# Patient Record
Sex: Male | Born: 1955 | Hispanic: Yes | Marital: Single | Smoking: Current every day smoker
Health system: Southern US, Community
[De-identification: ages and names within clinical notes are randomized; demographics above are authoritative.]

## PROBLEM LIST (undated history)

## (undated) DIAGNOSIS — J189 Pneumonia, unspecified organism: Secondary | ICD-10-CM

## (undated) DIAGNOSIS — H9190 Unspecified hearing loss, unspecified ear: Secondary | ICD-10-CM

## (undated) HISTORY — PX: KNEE SURGERY: SHX244

---

## 2010-02-05 ENCOUNTER — Emergency Department (HOSPITAL_COMMUNITY): Admission: EM | Admit: 2010-02-05 | Discharge: 2010-02-05 | Payer: Self-pay | Admitting: Emergency Medicine

## 2010-07-16 LAB — RAPID STREP SCREEN (MED CTR MEBANE ONLY): Streptococcus, Group A Screen (Direct): NEGATIVE

## 2012-11-01 ENCOUNTER — Emergency Department (HOSPITAL_COMMUNITY)
Admission: EM | Admit: 2012-11-01 | Discharge: 2012-11-01 | Disposition: A | Discharge status: Home / Self Care | Payer: Self-pay | Attending: Emergency Medicine | Admitting: Emergency Medicine

## 2012-11-01 ENCOUNTER — Encounter (HOSPITAL_COMMUNITY): Payer: Self-pay | Admitting: *Deleted

## 2012-11-01 ENCOUNTER — Emergency Department (HOSPITAL_COMMUNITY): Payer: Self-pay

## 2012-11-01 DIAGNOSIS — S4980XA Other specified injuries of shoulder and upper arm, unspecified arm, initial encounter: Secondary | ICD-10-CM | POA: Insufficient documentation

## 2012-11-01 DIAGNOSIS — S46909A Unspecified injury of unspecified muscle, fascia and tendon at shoulder and upper arm level, unspecified arm, initial encounter: Secondary | ICD-10-CM | POA: Insufficient documentation

## 2012-11-01 DIAGNOSIS — S139XXA Sprain of joints and ligaments of unspecified parts of neck, initial encounter: Secondary | ICD-10-CM | POA: Insufficient documentation

## 2012-11-01 DIAGNOSIS — Y92410 Unspecified street and highway as the place of occurrence of the external cause: Secondary | ICD-10-CM | POA: Insufficient documentation

## 2012-11-01 DIAGNOSIS — W19XXXA Unspecified fall, initial encounter: Secondary | ICD-10-CM

## 2012-11-01 DIAGNOSIS — Y93H2 Activity, gardening and landscaping: Secondary | ICD-10-CM | POA: Insufficient documentation

## 2012-11-01 DIAGNOSIS — M25512 Pain in left shoulder: Secondary | ICD-10-CM

## 2012-11-01 DIAGNOSIS — S161XXA Strain of muscle, fascia and tendon at neck level, initial encounter: Secondary | ICD-10-CM

## 2012-11-01 DIAGNOSIS — F17200 Nicotine dependence, unspecified, uncomplicated: Secondary | ICD-10-CM | POA: Insufficient documentation

## 2012-11-01 MED ORDER — HYDROCODONE-ACETAMINOPHEN 5-325 MG PO TABS: 1.0000 | ORAL_TABLET | ORAL | Status: DC | PRN

## 2012-11-01 MED ORDER — CYCLOBENZAPRINE HCL 10 MG PO TABS: 10.0000 mg | ORAL_TABLET | Freq: Two times a day (BID) | ORAL | Status: DC | PRN

## 2012-11-01 NOTE — ED Notes (Signed)
Pain lt side of neck and shoulder,  Pt got off tractor quickly due to radiator  Bursting and hurt his lt shoulder and lt side of neck,  No LOC  Pain with motion

## 2012-11-01 NOTE — ED Provider Notes (Signed)
Medical screening examination/treatment/procedure(s) were performed by non-physician practitioner and as supervising physician I was immediately available for consultation/collaboration. Salli Bodin, MD, FACEP   Laveta Gilkey L Greyson Riccardi, MD 11/01/12 1559 
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
## 2012-11-01 NOTE — ED Notes (Signed)
Shoulder immobilizer placed on L shoulder per NP order.

## 2013-02-27 ENCOUNTER — Emergency Department (HOSPITAL_COMMUNITY)
Admission: EM | Admit: 2013-02-27 | Discharge: 2013-02-27 | Disposition: A | Discharge status: Home / Self Care | Payer: Self-pay | Attending: Emergency Medicine | Admitting: Emergency Medicine

## 2013-02-27 ENCOUNTER — Encounter (HOSPITAL_COMMUNITY): Payer: Self-pay | Admitting: Emergency Medicine

## 2013-02-27 DIAGNOSIS — F17200 Nicotine dependence, unspecified, uncomplicated: Secondary | ICD-10-CM | POA: Insufficient documentation

## 2013-02-27 DIAGNOSIS — R5383 Other fatigue: Secondary | ICD-10-CM

## 2013-02-27 DIAGNOSIS — R0602 Shortness of breath: Secondary | ICD-10-CM | POA: Insufficient documentation

## 2013-02-27 DIAGNOSIS — Z79899 Other long term (current) drug therapy: Secondary | ICD-10-CM | POA: Insufficient documentation

## 2013-02-27 DIAGNOSIS — M2550 Pain in unspecified joint: Secondary | ICD-10-CM | POA: Insufficient documentation

## 2013-02-27 DIAGNOSIS — R5381 Other malaise: Secondary | ICD-10-CM | POA: Insufficient documentation

## 2013-02-27 DIAGNOSIS — H539 Unspecified visual disturbance: Secondary | ICD-10-CM | POA: Insufficient documentation

## 2013-02-27 LAB — CBC WITH DIFFERENTIAL/PLATELET
Basophils Absolute: 0.1 10*3/uL (ref 0.0–0.1)
Basophils Relative: 1 % (ref 0–1)
Hemoglobin: 14.4 g/dL (ref 13.0–17.0)
MCHC: 33.4 g/dL (ref 30.0–36.0)
Neutro Abs: 4.9 10*3/uL (ref 1.7–7.7)
Neutrophils Relative %: 53 % (ref 43–77)
Platelets: 314 10*3/uL (ref 150–400)
RDW: 13.4 % (ref 11.5–15.5)

## 2013-02-27 LAB — BASIC METABOLIC PANEL
Chloride: 102 mEq/L (ref 96–112)
GFR calc Af Amer: >90 mL/min (ref >90)
Potassium: 3.8 mEq/L (ref 3.5–5.1)
Sodium: 138 mEq/L (ref 135–145)

## 2013-02-27 LAB — GLUCOSE, CAPILLARY: Glucose-Capillary: 101 mg/dL — ABNORMAL HIGH (ref 70–99)

## 2013-02-27 NOTE — ED Provider Notes (Signed)
CSN: [id number]     Arrival date & time 02/27/13  1308 History  This chart was scribed for Dagmar Hait, MD by Bennett Scrape, ED Scribe. This patient was seen in room APA05/APA05 and the patient's care was started at 3:16 PM.     Chief Complaint  Patient presents with  . Hypertension    Patient is a 57 y.o. male presenting with general illness. The history is provided by the patient. No language interpreter was used.  Illness Location:  Diffuse Quality:  Fatigue Severity:  Mild Onset quality:  Gradual Duration:  2 days Timing:  Constant Progression:  Worsening Chronicity:  New Context:  Worked past 60 days straight, started to feel fatigued 2 days ago after getting off of work Associated symptoms: chest pain (brief, now resolved) and fatigue   Associated symptoms: no cough, no diarrhea, no fever, no nausea and no vomiting     HPI Comments: Darrell Parker is a 57 y.o. male who presents to the Emergency Department complaining of 2 days of gradual onset, persistent fatigue that started after working 60 days straight. He reports that he has been staying hydrated and sleep a full night's sleep but has been unable to. On a ladder at work today, he states that he closed his eyes and when he opened them he saw 'clouds' and his vision became shaky. He states that the episode lasted 1 to 2 minutes. He reports one episode of CP 2 days ago after getting off of work. He states that it felt like a hiccup that went away after a few minutes. He states that he also feels SOB described as feeling like he has to force taking a deep breath more than normal. He also c/o diffuse joint pain but denies any emesis, fevers, abdominal pain, hematochezia or diarrhea. He denies any difficulties in his gait. He says that he spoke with his supervisor and asked to be sent home from the job. He states that he was prescribed Norco for ankle pain and finished the prescription last week. He denies being on any pain  medication currently. He denies having a h/o HTN or being on any hypertensives.   History reviewed. No pertinent past medical history. Past Surgical History  Procedure Laterality Date  . Knee surgery     History reviewed. No pertinent family history. History  Substance Use Topics  . Smoking status: Current Every Day Smoker  . Smokeless tobacco: Not on file  . Alcohol Use: No    Review of Systems  Constitutional: Positive for fatigue. Negative for fever.  Eyes: Positive for visual disturbance.  Respiratory: Negative for cough.   Cardiovascular: Positive for chest pain (brief, now resolved).  Gastrointestinal: Negative for nausea, vomiting, diarrhea and blood in stool.  All other systems reviewed and are negative.    Allergies  Motrin  Home Medications   Current Outpatient Rx  Name  Route  Sig  Dispense  Refill  . acetaminophen (TYLENOL) 325 MG tablet   Oral   Take 650 mg by mouth every 6 (six) hours as needed for pain.         . cyclobenzaprine (FLEXERIL) 10 MG tablet   Oral   Take 1 tablet (10 mg total) by mouth 2 (two) times daily as needed for muscle spasms.   20 tablet   0   . HYDROcodone-acetaminophen (NORCO/VICODIN) 5-325 MG per tablet   Oral   Take 1 tablet by mouth every 4 (four) hours as needed.   20  tablet   0    Triage Vitals: BP 144/69  Pulse 70  Temp(Src) 98 F (36.7 C) (Oral)  Resp 20  Ht 6' (1.829 m)  Wt 235 lb (106.595 kg)  BMI 31.86 kg/m2  SpO2 100%  Physical Exam  Nursing note and vitals reviewed. Constitutional: He is oriented to person, place, and time. He appears well-developed and well-nourished. No distress.  HENT:  Head: Normocephalic and atraumatic.  Mouth/Throat: Oropharynx is clear and moist.  Eyes: Conjunctivae and EOM are normal. Pupils are equal, round, and reactive to light.  Neck: Neck supple. No tracheal deviation present.  Cardiovascular: Normal rate, regular rhythm and normal heart sounds.   Pulmonary/Chest:  Effort normal and breath sounds normal. No respiratory distress.  Musculoskeletal: Normal range of motion.  Neurological: He is alert and oriented to person, place, and time.  Skin: Skin is warm and dry.  Psychiatric: He has a normal mood and affect. His behavior is normal.    ED Course  Procedures (including critical care time)  DIAGNOSTIC STUDIES: Oxygen Saturation is 100% on room air, normal by my interpretation.    COORDINATION OF CARE: 3:23 PM-Discussed discharge plan which includes work note for 2 days with pt at bedside and pt agreed to plan. Addressed symptoms to return for with pt.   Labs Review Labs Reviewed  CBC WITH DIFFERENTIAL - Abnormal; Notable for the following:    Eosinophils Relative 8 (*)    All other components within normal limits  BASIC METABOLIC PANEL - Abnormal; Notable for the following:    Glucose, Bld 113 (*)    All other components within normal limits  GLUCOSE, CAPILLARY - Abnormal; Notable for the following:    Glucose-Capillary 101 (*)    All other components within normal limits   Imaging Review No results found.  EKG Interpretation     Ventricular Rate:  77 PR Interval:  160 QRS Duration: 90 QT Interval:  408 QTC Calculation: 461 R Axis:   67 Text Interpretation:  Normal sinus rhythm Normal ECG No previous ECGs available            MDM   1. Fatigue    57 year old male presents with fatigue. States she's been working for the past 60 days without a day off. He denies any fevers, chest pain, shortness of breath. He states fatigue over the past several days. He's not been sleeping to increase work load. He stated today he closed his eyes on a ladder open his eyes and saw some stars. He did not have shooting lights or halos consistent with retinal attachment. His vision now is normal. Your port 1 hiccups chest pain that lasted about 5 seconds, but not no chest pain. Patient stated later that he really just wants a note for work to 3  days off. Patient's labs normal. Exam is benign. I'm not concerned about stroke, ACS, infectious source. Given note for work and discharged home.5    Dagmar Hait, MD 02/27/13 [phone]

## 2013-02-27 NOTE — ED Notes (Addendum)
Pt states has not had a day off work in 65 days. Feels like his bp is high. C/o being "really really" tired. Alert/oriented. Nad. Denies pain. Denies n/v/d/dizziness/sob. Has had some blurred vision while at work and saw "white clouds". nad at this time. Denies gu/gi sx's.

## 2013-02-27 NOTE — ED Notes (Signed)
ekg and cbg done in triage at this time. Darrell Parker

## 2014-03-05 ENCOUNTER — Encounter (HOSPITAL_COMMUNITY): Payer: Self-pay | Admitting: Emergency Medicine

## 2014-03-05 ENCOUNTER — Emergency Department (HOSPITAL_COMMUNITY)
Admission: EM | Admit: 2014-03-05 | Discharge: 2014-03-05 | Disposition: A | Discharge status: Home / Self Care | Payer: Self-pay | Attending: Emergency Medicine | Admitting: Emergency Medicine

## 2014-03-05 ENCOUNTER — Emergency Department (HOSPITAL_COMMUNITY): Payer: Self-pay

## 2014-03-05 DIAGNOSIS — Z720 Tobacco use: Secondary | ICD-10-CM | POA: Insufficient documentation

## 2014-03-05 DIAGNOSIS — W3189XA Contact with other specified machinery, initial encounter: Secondary | ICD-10-CM | POA: Insufficient documentation

## 2014-03-05 DIAGNOSIS — Y9389 Activity, other specified: Secondary | ICD-10-CM | POA: Insufficient documentation

## 2014-03-05 DIAGNOSIS — Y288XXA Contact with other sharp object, undetermined intent, initial encounter: Secondary | ICD-10-CM | POA: Insufficient documentation

## 2014-03-05 DIAGNOSIS — Y990 Civilian activity done for income or pay: Secondary | ICD-10-CM | POA: Insufficient documentation

## 2014-03-05 DIAGNOSIS — Y9289 Other specified places as the place of occurrence of the external cause: Secondary | ICD-10-CM | POA: Insufficient documentation

## 2014-03-05 DIAGNOSIS — R52 Pain, unspecified: Secondary | ICD-10-CM

## 2014-03-05 DIAGNOSIS — S61432A Puncture wound without foreign body of left hand, initial encounter: Secondary | ICD-10-CM | POA: Insufficient documentation

## 2014-03-05 MED ORDER — DOXYCYCLINE HYCLATE 100 MG PO CAPS: 100.0000 mg | ORAL_CAPSULE | Freq: Two times a day (BID) | ORAL | Status: DC

## 2014-03-05 MED ORDER — TETANUS-DIPHTH-ACELL PERTUSSIS 5-2.5-18.5 LF-MCG/0.5 IM SUSP
0.5000 mL | Freq: Once | INTRAMUSCULAR | Status: AC
  Administered 2014-03-05: 0.5 mL via INTRAMUSCULAR
  Filled 2014-03-05: qty 0.5

## 2014-03-05 MED ORDER — DOXYCYCLINE HYCLATE 100 MG PO TABS
100.0000 mg | ORAL_TABLET | Freq: Once | ORAL | Status: AC
  Administered 2014-03-05: 100 mg via ORAL
  Filled 2014-03-05: qty 1

## 2014-03-05 MED ORDER — AMOXICILLIN-POT CLAVULANATE 875-125 MG PO TABS
1.0000 | ORAL_TABLET | Freq: Once | ORAL | Status: AC
  Administered 2014-03-05: 1 via ORAL
  Filled 2014-03-05: qty 1

## 2014-03-05 MED ORDER — AMOXICILLIN-POT CLAVULANATE 875-125 MG PO TABS: 1.0000 | ORAL_TABLET | Freq: Two times a day (BID) | ORAL | Status: DC

## 2014-03-05 NOTE — Discharge Instructions (Signed)
Keep hand clean with soap and water. Recheck in 2 days. Prescriptions for 2 antibiotics.   Hand injuries have a high incidence of infection.   Return for any worrisome signs.

## 2014-03-05 NOTE — ED Notes (Signed)
Finger splint applied.  Cap refill good both before and after splint with tape applied.

## 2014-03-05 NOTE — ED Notes (Signed)
Pt c/o of left finger pain.  Pt reports sensation in left hand with mild numbness to distal end left ring finger.  Pt is PWD, <3 sec cap refill.

## 2014-03-05 NOTE — ED Provider Notes (Signed)
CSN: [id number]     Arrival date & time 03/05/14  [id number] History  This chart was scribed for Donnetta HutchingBrian Rena Hunke, MD by Annye AsaAnna Dorsett, ED Scribe. This patient was seen in room APA19/APA19 and the patient's care was started at 9:25 AM.    Chief Complaint  Patient presents with  . Hand Injury   The history is provided by the patient. No language interpreter was used.     HPI Comments: Darrell Parker is a 58 y.o. male who presents to the Emergency Department complaining of left fourth finger pain. Patient reports that he was working on a machine at work this morning, involving heavy rollers and several needles; he states that his hand got caught in the machine and was stuck by the needle. Per a discussion with the nurse, he describes his pain as a burning pain, rated 5/10.   History reviewed. No pertinent past medical history. Past Surgical History  Procedure Laterality Date  . Knee surgery     History reviewed. No pertinent family history. History  Substance Use Topics  . Smoking status: Current Every Day Smoker  . Smokeless tobacco: Not on file  . Alcohol Use: No    Review of Systems  A complete 10 system review of systems was obtained and all systems are negative except as noted in the HPI and PMH.   Allergies  Motrin  Home Medications   Prior to Admission medications   Medication Sig Start Date End Date Taking? Authorizing Provider  amoxicillin-clavulanate (AUGMENTIN) 875-125 MG per tablet Take 1 tablet by mouth 2 (two) times daily. One po bid x 7 days 03/05/14   Donnetta HutchingBrian Amparo Donalson, MD  doxycycline (VIBRAMYCIN) 100 MG capsule Take 1 capsule (100 mg total) by mouth 2 (two) times daily. 03/05/14   Donnetta HutchingBrian Jalani Cullifer, MD   BP 104/50 mmHg  Pulse 79  Temp(Src) 98 F (36.7 C) (Oral)  Resp 16  Ht 5\' 10"  (1.778 m)  Wt 265 lb (120.203 kg)  BMI 38.02 kg/m2  SpO2 99% Physical Exam  Constitutional: He is oriented to person, place, and time. He appears well-developed and well-nourished.  HENT:  Head:  Normocephalic and atraumatic.  Eyes: Conjunctivae and EOM are normal. Pupils are equal, round, and reactive to light.  Neck: Normal range of motion. Neck supple.  Cardiovascular: Normal rate, regular rhythm and normal heart sounds.   Pulmonary/Chest: Effort normal and breath sounds normal.  Abdominal: Soft. Bowel sounds are normal.  Musculoskeletal: Normal range of motion.  PIP joint of left ring finger   Neurological: He is alert and oriented to person, place, and time.  Skin: Skin is warm and dry.  Psychiatric: He has a normal mood and affect. His behavior is normal.  Nursing note and vitals reviewed.   ED Course  Procedures   DIAGNOSTIC STUDIES: Oxygen Saturation is 99% on RA, normal by my interpretation.    COORDINATION OF CARE: 9:26 AM Discussed treatment plan with pt at bedside, including imaging studies and a tetanus shot, and pt agreed to plan.   Labs Review Labs Reviewed - No data to display  Imaging Review Dg Hand Complete Left  03/05/2014   CLINICAL DATA:  Pain involving the PIP joint of the left fourth digit. Left hand was struck with an industrial sewing machine at work last night.  EXAM: LEFT HAND - COMPLETE 3+ VIEW  COMPARISON:  None.  FINDINGS: Mild degenerative change of the DIP joints of the second, third and fifth digits as well as the PIP joint  of the fifth digit with joint space loss and osteophytosis.  There is a minimally displaced osteophyte arising from the dorsal aspect of the base of the distal phalanx of the middle digit. This finding without associated definitive adjacent soft tissue swelling.  There is a punctate (2-3 mm) radiopaque foreign body within the soft tissues about the proximal phalanx of the thumb.  IMPRESSION: 1. Minimally displaced osteophyte arising from the dorsal aspect of the base of the distal phalanx of the middle digit. An acute on chronic injury is not excluded and correlation point tenderness at this location is recommended. 2. Punctate  (2-3 mm) radiopaque foreign body within the soft tissues about the proximal phalanx of the thumb.   Electronically Signed   By: Simonne ComeJohn  Watts M.D.   On: 03/05/2014 10:05     EKG Interpretation None      MDM   Final diagnoses:  Pain  Puncture wound of left hand, initial encounter  puncture wound is on the dorsum of the left ring finger at the PIP joint. X-ray shows no abnormalities at that site. Rx tetanus update, doxycycline and Augmentin by mouth. Recheck in 2 days. Discussed possibility of infection with patient.  I personally performed the services described in this documentation, which was scribed in my presence. The recorded information has been reviewed and is accurate.      Donnetta HutchingBrian Roda Lauture, MD 03/05/14 1114

## 2014-04-03 ENCOUNTER — Encounter (HOSPITAL_COMMUNITY): Payer: Self-pay | Admitting: *Deleted

## 2014-04-03 ENCOUNTER — Emergency Department (HOSPITAL_COMMUNITY)
Admission: EM | Admit: 2014-04-03 | Discharge: 2014-04-03 | Disposition: A | Discharge status: Home / Self Care | Payer: Self-pay | Attending: Emergency Medicine | Admitting: Emergency Medicine

## 2014-04-03 ENCOUNTER — Emergency Department (HOSPITAL_COMMUNITY): Payer: Self-pay

## 2014-04-03 DIAGNOSIS — Z792 Long term (current) use of antibiotics: Secondary | ICD-10-CM | POA: Insufficient documentation

## 2014-04-03 DIAGNOSIS — M25512 Pain in left shoulder: Secondary | ICD-10-CM | POA: Insufficient documentation

## 2014-04-03 DIAGNOSIS — Z720 Tobacco use: Secondary | ICD-10-CM | POA: Insufficient documentation

## 2014-04-03 DIAGNOSIS — Z79899 Other long term (current) drug therapy: Secondary | ICD-10-CM | POA: Insufficient documentation

## 2014-04-03 DIAGNOSIS — R52 Pain, unspecified: Secondary | ICD-10-CM

## 2014-04-03 MED ORDER — CYCLOBENZAPRINE HCL 10 MG PO TABS
10.0000 mg | ORAL_TABLET | Freq: Once | ORAL | Status: AC
  Administered 2014-04-03: 10 mg via ORAL
  Filled 2014-04-03: qty 1

## 2014-04-03 MED ORDER — CYCLOBENZAPRINE HCL 10 MG PO TABS: 10.0000 mg | ORAL_TABLET | Freq: Two times a day (BID) | ORAL | Status: DC | PRN

## 2014-04-03 MED ORDER — HYDROCODONE-ACETAMINOPHEN 5-325 MG PO TABS
1.0000 | ORAL_TABLET | Freq: Once | ORAL | Status: AC
  Administered 2014-04-03: 1 via ORAL
  Filled 2014-04-03: qty 1
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
## 2014-04-03 NOTE — Discharge Instructions (Signed)
Continue to take tylenol and the medication we give you. Do not drive while taking the muscle relaxant as it will make you sleepy. Follow up with Dr. Romeo AppleHarrison. Return here as needed.

## 2014-04-03 NOTE — ED Notes (Signed)
Pt states he has had lt shoulder pain x2 weeks without injury, states last night "it froze up but it worked itself out".

## 2014-06-29 ENCOUNTER — Encounter (HOSPITAL_COMMUNITY): Payer: Self-pay

## 2014-06-29 ENCOUNTER — Emergency Department (HOSPITAL_COMMUNITY)
Admission: EM | Admit: 2014-06-29 | Discharge: 2014-06-29 | Disposition: A | Discharge status: Home / Self Care | Payer: Self-pay | Attending: Emergency Medicine | Admitting: Emergency Medicine

## 2014-06-29 ENCOUNTER — Emergency Department (HOSPITAL_COMMUNITY): Payer: Self-pay

## 2014-06-29 DIAGNOSIS — J159 Unspecified bacterial pneumonia: Secondary | ICD-10-CM | POA: Insufficient documentation

## 2014-06-29 DIAGNOSIS — J189 Pneumonia, unspecified organism: Secondary | ICD-10-CM

## 2014-06-29 DIAGNOSIS — Z720 Tobacco use: Secondary | ICD-10-CM | POA: Insufficient documentation

## 2014-06-29 DIAGNOSIS — J9801 Acute bronchospasm: Secondary | ICD-10-CM | POA: Insufficient documentation

## 2014-06-29 LAB — CBC WITH DIFFERENTIAL/PLATELET
BASOS ABS: 0.1 10*3/uL (ref 0.0–0.1)
BASOS PCT: 0 % (ref 0–1)
EOS PCT: 2 % (ref 0–5)
Eosinophils Absolute: 0.3 10*3/uL (ref 0.0–0.7)
HCT: 40.6 % (ref 39.0–52.0)
HEMOGLOBIN: 13.4 g/dL (ref 13.0–17.0)
LYMPHS ABS: 2.2 10*3/uL (ref 0.7–4.0)
LYMPHS PCT: 13 % (ref 12–46)
MCH: 28.9 pg (ref 26.0–34.0)
MCHC: 33 g/dL (ref 30.0–36.0)
MCV: 87.7 fL (ref 78.0–100.0)
MONO ABS: 0.9 10*3/uL (ref 0.1–1.0)
Monocytes Relative: 5 % (ref 3–12)
Neutro Abs: 13.1 10*3/uL — ABNORMAL HIGH (ref 1.7–7.7)
Neutrophils Relative %: 80 % — ABNORMAL HIGH (ref 43–77)
Platelets: 330 10*3/uL (ref 150–400)
RBC: 4.63 MIL/uL (ref 4.22–5.81)
RDW: 13.5 % (ref 11.5–15.5)
WBC: 16.5 10*3/uL — ABNORMAL HIGH (ref 4.0–10.5)

## 2014-06-29 LAB — BASIC METABOLIC PANEL
Anion gap: 4 — ABNORMAL LOW (ref 5–15)
BUN: 10 mg/dL (ref 6–23)
CO2: 22 mmol/L (ref 19–32)
Calcium: 8 mg/dL — ABNORMAL LOW (ref 8.4–10.5)
Chloride: 108 mmol/L (ref 96–112)
Creatinine, Ser: 0.74 mg/dL (ref 0.50–1.35)
GFR calc non Af Amer: >90 mL/min (ref >90)
Glucose, Bld: 120 mg/dL — ABNORMAL HIGH (ref 70–99)
Potassium: 3.4 mmol/L — ABNORMAL LOW (ref 3.5–5.1)
SODIUM: 134 mmol/L — AB (ref 135–145)

## 2014-06-29 MED ORDER — PREDNISONE 50 MG PO TABS
60.0000 mg | ORAL_TABLET | Freq: Once | ORAL | Status: AC
  Administered 2014-06-29: 60 mg via ORAL
  Filled 2014-06-29 (×2): qty 1

## 2014-06-29 MED ORDER — ALBUTEROL SULFATE HFA 108 (90 BASE) MCG/ACT IN AERS
2.0000 | INHALATION_SPRAY | Freq: Four times a day (QID) | RESPIRATORY_TRACT | Status: DC | PRN
  Administered 2014-06-29: 2 via RESPIRATORY_TRACT
  Filled 2014-06-29: qty 6.7

## 2014-06-29 MED ORDER — ALBUTEROL (5 MG/ML) CONTINUOUS INHALATION SOLN
10.0000 mg | INHALATION_SOLUTION | Freq: Once | RESPIRATORY_TRACT | Status: AC
  Filled 2014-06-29: qty 20

## 2014-06-29 MED ORDER — SODIUM CHLORIDE 0.9 % IV BOLUS (SEPSIS)
1000.0000 mL | Freq: Once | INTRAVENOUS | Status: AC
  Administered 2014-06-29: 1000 mL via INTRAVENOUS

## 2014-06-29 MED ORDER — ALBUTEROL SULFATE (2.5 MG/3ML) 0.083% IN NEBU
INHALATION_SOLUTION | RESPIRATORY_TRACT | Status: AC
  Administered 2014-06-29: 10 mg
  Filled 2014-06-29: qty 12

## 2014-06-29 MED ORDER — AEROCHAMBER Z-STAT PLUS/MEDIUM MISC: 1.0000 | Freq: Once | Status: DC

## 2014-06-29 MED ORDER — AZITHROMYCIN 250 MG PO TABS: 250.0000 mg | ORAL_TABLET | Freq: Every day | ORAL | Status: DC

## 2014-06-29 MED ORDER — ALBUTEROL (5 MG/ML) CONTINUOUS INHALATION SOLN
10.0000 mg/h | INHALATION_SOLUTION | Freq: Once | RESPIRATORY_TRACT | Status: AC
Start: 2014-06-29 — End: 2014-06-29
  Administered 2014-06-29: 10 mg/h via RESPIRATORY_TRACT
  Filled 2014-06-29: qty 20

## 2014-06-29 MED ORDER — DEXTROSE 5 % IV SOLN
1.0000 g | Freq: Once | INTRAVENOUS | Status: AC
  Administered 2014-06-29: 1 g via INTRAVENOUS
  Filled 2014-06-29: qty 10

## 2014-06-29 MED ORDER — DM-GUAIFENESIN ER 30-600 MG PO TB12
1.0000 | ORAL_TABLET | Freq: Two times a day (BID) | ORAL | Status: DC | PRN
Start: 2014-06-29 — End: 2015-02-14

## 2014-06-29 MED ORDER — PREDNISONE 20 MG PO TABS: ORAL_TABLET | ORAL | Status: DC

## 2014-06-29 MED ORDER — DEXTROSE 5 % IV SOLN
500.0000 mg | INTRAVENOUS | Status: DC
  Administered 2014-06-29: 500 mg via INTRAVENOUS
  Filled 2014-06-29: qty 500

## 2014-06-29 MED ORDER — IPRATROPIUM BROMIDE 0.02 % IN SOLN
0.5000 mg | Freq: Once | RESPIRATORY_TRACT | Status: AC
  Administered 2014-06-29: 0.5 mg via RESPIRATORY_TRACT
  Filled 2014-06-29: qty 2.5

## 2014-06-29 NOTE — ED Notes (Signed)
Pt states he's had persistent cough and sob for 6 weeks, yesterday and tonight being worst. Has paroxsymal cough upon admission

## 2014-06-29 NOTE — ED Notes (Signed)
Pt 02 sats stayed 93-94% while ambulating. No c/o SOB

## 2014-06-29 NOTE — ED Notes (Signed)
Aerochamber provided by RT.

## 2014-06-29 NOTE — Discharge Instructions (Signed)
Drink plenty of fluids. Use the inhaler 2 puffs every 4-6 hours for wheezing or shortness of breath. Take the Mucinex DM for cough. Take the prednisone daily until gone. Start the antibiotic, Zithromax Sunday morning and take once a day for 4 days. Return to the emergency room if you feel worse such as struggling to breathe again, worsening chest pain, vomiting.

## 2014-06-29 NOTE — ED Notes (Signed)
Rt aware of new orders.

## 2014-06-29 NOTE — ED Provider Notes (Signed)
CSN: [id number]     Arrival date & time 06/29/14  [id number] History   First MD Initiated Contact with Patient 06/29/14 [phone]     Chief Complaint  Patient presents with  . Cough     (Consider location/radiation/quality/duration/timing/severity/associated sxs/prior Treatment) HPI  Patient reports he started having a cough 6 weeks ago. He states he started taking cough drops, Alka-Seltzer plus, but found that Mucinex helped the most. He states initially he was coughing up a lot of green mucus however he is still coughing up mucus but not as often. He states sometimes now it has spread goals of red in it. He denies any sneezing or rhinorrhea. He states he now has a sore throat when he coughs. He states when he has these coughing spells he feels short of breath especially today when he felt like he could not catch his breath. He thinks he may have some wheezing which he's never had before. He denies nausea, vomiting, diarrhea, or fever.  PCP none  History reviewed. No pertinent past medical history. Past Surgical History  Procedure Laterality Date  . Knee surgery     No family history on file. History  Substance Use Topics  . Smoking status: Current Every Day Smoker -- 0.25 packs/day  . Smokeless tobacco: Not on file  . Alcohol Use: No  employed Smokes 1 ppweek  Review of Systems  All other systems reviewed and are negative.     Allergies  Motrin  Home Medications   None   BP 143/77 mmHg  Pulse 96  Temp(Src) 98.4 F (36.9 C) (Oral)  Resp 24  Ht 5\' 9"  (1.753 m)  Wt 232 lb (105.235 kg)  BMI 34.24 kg/m2  SpO2 91%  Vital signs normal except borderline low oxygen with nasal cannula.   Physical Exam  Constitutional: He is oriented to person, place, and time. He appears well-developed and well-nourished.  Non-toxic appearance. He does not appear ill. No distress.  HENT:  Head: Normocephalic and atraumatic.  Right Ear: External ear normal.  Left Ear: External ear normal.   Nose: Nose normal. No mucosal edema or rhinorrhea.  Mouth/Throat: Oropharynx is clear and moist and mucous membranes are normal. No dental abscesses or uvula swelling.  Eyes: Conjunctivae and EOM are normal. Pupils are equal, round, and reactive to light.  Neck: Normal range of motion and full passive range of motion without pain. Neck supple.  Cardiovascular: Normal rate, regular rhythm and normal heart sounds.  Exam reveals no gallop and no friction rub.   No murmur heard. Pulmonary/Chest: Effort normal. No respiratory distress. He has decreased breath sounds. He has no wheezes. He has no rhonchi. He has no rales. He exhibits no tenderness and no crepitus.  Whenever patient takes it a deep breath he starts coughing. At times I think I hear a faint wheeze.  Abdominal: Soft. Normal appearance and bowel sounds are normal. He exhibits no distension. There is no tenderness. There is no rebound and no guarding.  Musculoskeletal: Normal range of motion. He exhibits no edema or tenderness.  Moves all extremities well.   Neurological: He is alert and oriented to person, place, and time. He has normal strength. No cranial nerve deficit.  Skin: Skin is warm, dry and intact. No rash noted. No erythema. No pallor.  Psychiatric: He has a normal mood and affect. His speech is normal and behavior is normal. His mood appears not anxious.  Nursing note and vitals reviewed.   ED Course  Procedures (including  critical care time)  Medications  azithromycin (ZITHROMAX) 500 mg in dextrose 5 % 250 mL IVPB (500 mg Intravenous New Bag/Given 06/29/14 0634)  albuterol (PROVENTIL,VENTOLIN) solution continuous neb (not administered)  ipratropium (ATROVENT) nebulizer solution 0.5 mg (not administered)  albuterol (PROVENTIL HFA;VENTOLIN HFA) 108 (90 BASE) MCG/ACT inhaler 2 puff (not administered)  aerochamber Z-Stat Plus/medium 1 each (not administered)  albuterol (PROVENTIL,VENTOLIN) solution continuous neb (10 mg/hr  Nebulization Given 06/29/14 0405)  ipratropium (ATROVENT) nebulizer solution 0.5 mg (0.5 mg Nebulization Given 06/29/14 0405)  predniSONE (DELTASONE) tablet 60 mg (60 mg Oral Given 06/29/14 0401)  sodium chloride 0.9 % bolus 1,000 mL (1,000 mLs Intravenous New Bag/Given 06/29/14 0552)  cefTRIAXone (ROCEPHIN) 1 g in dextrose 5 % 50 mL IVPB (0 g Intravenous Stopped 06/29/14 9604)    Patient was started on a albuterol and Atrovent nebulizer treatment. He was also given prednisone. After reviewing his x-ray he was started on Rocephin 1 g IV was started for Max 500 mg IV.  Patient was angulated by nursing staff and they report his pulse ox remained 91% on room air with heart rate 110.  Recheck at 5:20 AM patient finishing his nebulizer treatment. He has some improved air movement however he still is having a lot of coughing.  Recheck 07:00 patient's pulse ox is now 98% on the monitor on room air. He states he's feeling better. On lung exam he has improved air movement and now he has more wheezing than he did before. He is still having a lot of coughing. I'm going to add another nebulizer 10 mg and reassess. However with his oxygenation improving with the nebulizers so he will be able to be discharged.  Pt left at change of shift with Dr Bebe Shaggy to recheck after his second nebulizer.   Labs Review Results for orders placed or performed during the hospital encounter of 06/29/14  Basic metabolic panel  Result Value Ref Range   Sodium 134 (L) 135 - 145 mmol/L   Potassium 3.4 (L) 3.5 - 5.1 mmol/L   Chloride 108 96 - 112 mmol/L   CO2 22 19 - 32 mmol/L   Glucose, Bld 120 (H) 70 - 99 mg/dL   BUN 10 6 - 23 mg/dL   Creatinine, Ser 5.40 0.50 - 1.35 mg/dL   Calcium 8.0 (L) 8.4 - 10.5 mg/dL   GFR calc non Af Amer >90 >90 mL/min   GFR calc Af Amer >90 >90 mL/min   Anion gap 4 (L) 5 - 15  CBC with Differential  Result Value Ref Range   WBC 16.5 (H) 4.0 - 10.5 K/uL   RBC 4.63 4.22 - 5.81 MIL/uL    Hemoglobin 13.4 13.0 - 17.0 g/dL   HCT 98.1 19.1 - 47.8 %   MCV 87.7 78.0 - 100.0 fL   MCH 28.9 26.0 - 34.0 pg   MCHC 33.0 30.0 - 36.0 g/dL   RDW 29.5 62.1 - 30.8 %   Platelets 330 150 - 400 K/uL   Neutrophils Relative % 80 (H) 43 - 77 %   Neutro Abs 13.1 (H) 1.7 - 7.7 K/uL   Lymphocytes Relative 13 12 - 46 %   Lymphs Abs 2.2 0.7 - 4.0 K/uL   Monocytes Relative 5 3 - 12 %   Monocytes Absolute 0.9 0.1 - 1.0 K/uL   Eosinophils Relative 2 0 - 5 %   Eosinophils Absolute 0.3 0.0 - 0.7 K/uL   Basophils Relative 0 0 - 1 %   Basophils Absolute  0.1 0.0 - 0.1 K/uL    Laboratory interpretation all normal except leukocytosis    Imaging Review Dg Chest 2 View  06/29/2014   CLINICAL DATA:  Cough for 6 weeks. Increasing shortness of breath and chest pain for 1 day. Wheezing.  EXAM: CHEST  2 VIEW  COMPARISON:  02/05/2010  FINDINGS: Patchy airspace opacity at the right lung base. Small focal patchy opacity in the right midlung zone. Trace atelectasis at the left costophrenic angle, left lung is otherwise clear. Cardiomediastinal contours are normal. There is no pleural effusion or pneumothorax. Pulmonary vasculature is normal. No acute osseous abnormalities seen.  IMPRESSION: Right lower lobe consolidation consistent with pneumonia. Small focal opacity right mid lung zone, likely focal pneumonitis. Continued radiographic follow-up is recommended after course of treatment to document resolution.   Electronically Signed   By: Rubye Oaks M.D.   On: 06/29/2014 05:00     EKG Interpretation None       Date: 06/29/2014  Rate: 96  Rhythm: normal sinus rhythm  QRS Axis: right  Intervals: QT prolonged  ST/T Wave abnormalities: nonspecific ST/T changes  Conduction Disutrbances:none  Narrative Interpretation:   Old EKG Reviewed: none available      MDM   Final diagnoses:  CAP (community acquired pneumonia)  Bronchospasm   New Prescriptions   AZITHROMYCIN (ZITHROMAX Z-PAK) 250 MG TABLET     Take 1 tablet (250 mg total) by mouth daily. Start on Sunday, February 29th in the morning   DEXTROMETHORPHAN-GUAIFENESIN (MUCINEX DM) 30-600 MG PER 12 HR TABLET    Take 1 tablet by mouth 2 (two) times daily as needed for cough.   PREDNISONE (DELTASONE) 20 MG TABLET    Take 3 po QD x 3d , then 2 po QD x 3d then 1 po QD x 3d    Plan discharge  Devoria Albe, MD, Franz Dell, MD 06/29/14 [phone]

## 2014-06-29 NOTE — ED Provider Notes (Signed)
Pt improved Ambulated without difficulty He is in no distress CXR reviewed Advised need for repeat CXR in one month after taking antibiotics Discussed strict return precautions BP 114/56 mmHg  Pulse 98  Temp(Src) 97.7 F (36.5 C) (Oral)  Resp 19  Ht 5\' 9"  (1.753 m)  Wt 232 lb (105.235 kg)  BMI 34.24 kg/m2  SpO2 98%   Joya Gaskinsonald W Brazos Sandoval, MD 06/29/14 [phone]

## 2014-06-29 NOTE — ED Notes (Signed)
Patient ambulated with assist O2 at 91% tolerated well. HR 110

## 2014-07-01 ENCOUNTER — Encounter (HOSPITAL_COMMUNITY): Payer: Self-pay | Admitting: *Deleted

## 2014-07-01 ENCOUNTER — Emergency Department (HOSPITAL_COMMUNITY)
Admission: EM | Admit: 2014-07-01 | Discharge: 2014-07-01 | Disposition: A | Discharge status: Home / Self Care | Payer: Self-pay | Attending: Emergency Medicine | Admitting: Emergency Medicine

## 2014-07-01 DIAGNOSIS — J189 Pneumonia, unspecified organism: Secondary | ICD-10-CM

## 2014-07-01 DIAGNOSIS — Z720 Tobacco use: Secondary | ICD-10-CM | POA: Insufficient documentation

## 2014-07-01 DIAGNOSIS — Z792 Long term (current) use of antibiotics: Secondary | ICD-10-CM | POA: Insufficient documentation

## 2014-07-01 DIAGNOSIS — Z79899 Other long term (current) drug therapy: Secondary | ICD-10-CM | POA: Insufficient documentation

## 2014-07-01 DIAGNOSIS — J159 Unspecified bacterial pneumonia: Secondary | ICD-10-CM | POA: Insufficient documentation

## 2014-07-01 MED ORDER — PROMETHAZINE-CODEINE 6.25-10 MG/5ML PO SYRP: ORAL_SOLUTION | ORAL | Status: DC

## 2014-07-01 NOTE — ED Notes (Signed)
Seen here 2/27 with pneumonia, Wants a work note to be out longer

## 2014-07-01 NOTE — ED Provider Notes (Signed)
CSN: [id number]     Arrival date & time 07/01/14  1040 History  This chart was scribed for non-physician practitioner Ivery Quale, PA-C, working with Benny Lennert, MD by Littie Deeds, ED Scribe. This patient was seen in room APFT21/APFT21 and the patient's care was started at 12:38 PM.     No chief complaint on file.  The history is provided by the patient. No language interpreter was used.    HPI Comments: Darrell Parker is a 59 y.o. male who presents to the Emergency Department for a follow-up. Patient was seen here 2 days ago and was diagnosed with pneumonia. He feels the medications he was given have been working, but slowly. He initially did not want to be written out of work for that long because he thought he would recover sooner, but now he would like a work note to be out longer. Patient still has a cough. The antibiotics have been helping his cough. He does smoke about 1 pack per week.   History reviewed. No pertinent past medical history. Past Surgical History  Procedure Laterality Date  . Knee surgery     History reviewed. No pertinent family history. History  Substance Use Topics  . Smoking status: Current Every Day Smoker -- 0.25 packs/day    Types: Cigarettes  . Smokeless tobacco: Not on file  . Alcohol Use: No    Review of Systems  Respiratory: Positive for cough.   All other systems reviewed and are negative.     Allergies  Motrin  Home Medications   Prior to Admission medications   Medication Sig Start Date End Date Taking? Authorizing Provider  Aspirin-Caffeine (BC FAST PAIN RELIEF ARTHRITIS PO) Take 1 Package by mouth daily as needed (PAIN).   Yes Historical Provider, MD  azithromycin (ZITHROMAX Z-PAK) 250 MG tablet Take 1 tablet (250 mg total) by mouth daily. Start on Sunday, February 29th in the morning 06/30/14  Yes Ward Givens, MD  dextromethorphan-guaiFENesin [facility] DM) 30-600 MG per 12 hr tablet Take 1 tablet by mouth 2 (two) times daily as needed  for cough. 06/29/14  Yes Ward Givens, MD  predniSONE (DELTASONE) 20 MG tablet Take 3 po QD x 3d , then 2 po QD x 3d then 1 po QD x 3d 06/29/14  Yes Ward Givens, MD  cyclobenzaprine (FLEXERIL) 10 MG tablet Take 1 tablet (10 mg total) by mouth 2 (two) times daily as needed for muscle spasms. Patient not taking: Reported on 06/29/2014 04/03/14   Janne Napoleon, NP   BP 151/82 mmHg  Pulse 88  Temp(Src) 98.8 F (37.1 C)  Ht 6' (1.829 m)  Wt 232 lb (105.235 kg)  BMI 31.46 kg/m2  SpO2 96% Physical Exam  Constitutional: He is oriented to person, place, and time. He appears well-developed and well-nourished. No distress.  HENT:  Head: Normocephalic and atraumatic.  Mouth/Throat: Oropharynx is clear and moist. No oropharyngeal exudate.  Eyes: Pupils are equal, round, and reactive to light.  Neck: Neck supple.  Cardiovascular: Normal rate, regular rhythm and normal heart sounds.   No murmur heard. Pulmonary/Chest: Effort normal. No respiratory distress. He has no wheezes.  Symmetrical rise and fall of the chest. Coarse breath sounds present. No wheeze. Cough with deep breathing.  Musculoskeletal: He exhibits no edema.  Neurological: He is alert and oriented to person, place, and time. No cranial nerve deficit.  Skin: Skin is warm and dry. No rash noted.  Psychiatric: He has a normal mood and  affect. His behavior is normal.  Nursing note and vitals reviewed.   ED Course  Procedures  DIAGNOSTIC STUDIES: Oxygen Saturation is 96% on room air, adequate by my interpretation.    COORDINATION OF CARE: 12:46 PM-Discussed treatment plan which includes adding promethazine/codeine cough medication at bedtime with pt at bedside and pt agreed to plan. Will extend work excuse note to the next Monday, 07/08/14.  Labs Review Labs Reviewed - No data to display  Imaging Review No results found.   EKG Interpretation None      MDM  Pneumonia symptoms seem to be improving. Pulse ox 96%. Pt speaks in  complete sentences, but symptoms have not resolved.  Work excuse extended for 1 week.   Final diagnoses:  None    **I have reviewed nursing notes, vital signs, and all appropriate lab and imaging results for this patient.*  **I personally performed the services described in this documentation, which was scribed in my presence. The recorded information has been reviewed and is accurate.Kathie Dike*   Karman Veney M Adaly Puder, PA-C 07/02/14 2031  Benny LennertJoseph L Zammit, MD 07/04/14 [phone]

## 2014-07-01 NOTE — Discharge Instructions (Signed)
Pneumonia, Adult  Pneumonia is an infection of the lungs. It may be caused by a germ (virus or bacteria). Some types of pneumonia can spread easily from person to person. This can happen when you cough or sneeze.  HOME CARE   Only take medicine as told by your doctor.   Take your medicine (antibiotics) as told. Finish it even if you start to feel better.   Do not smoke.   You may use a vaporizer or humidifier in your room. This can help loosen thick spit (mucus).   Sleep so you are almost sitting up (semi-upright). This helps reduce coughing.   Rest.  A shot (vaccine) can help prevent pneumonia. Shots are often advised for:   People over 65 years old.   Patients on chemotherapy.   People with long-term (chronic) lung problems.   People with immune system problems.  GET HELP RIGHT AWAY IF:    You are getting worse.   You cannot control your cough, and you are losing sleep.   You cough up blood.   Your pain gets worse, even with medicine.   You have a fever.   Any of your problems are getting worse, not better.   You have shortness of breath or chest pain.  MAKE SURE YOU:    Understand these instructions.   Will watch your condition.   Will get help right away if you are not doing well or get worse.  Document Released: 10/06/2007 Document Revised: 07/12/2011 Document Reviewed: 07/10/2010  [employer] Patient Information 2015 [employer]. This information is not intended to replace advice given to you by your health care provider. Make sure you discuss any questions you have with your health care provider.

## 2014-09-02 ENCOUNTER — Emergency Department (HOSPITAL_COMMUNITY)
Admission: EM | Admit: 2014-09-02 | Discharge: 2014-09-02 | Disposition: A | Discharge status: Home / Self Care | Payer: Self-pay | Attending: Emergency Medicine | Admitting: Emergency Medicine

## 2014-09-02 ENCOUNTER — Emergency Department (HOSPITAL_COMMUNITY): Payer: Self-pay

## 2014-09-02 ENCOUNTER — Encounter (HOSPITAL_COMMUNITY): Payer: Self-pay | Admitting: Emergency Medicine

## 2014-09-02 DIAGNOSIS — R51 Headache: Secondary | ICD-10-CM | POA: Insufficient documentation

## 2014-09-02 DIAGNOSIS — Z8701 Personal history of pneumonia (recurrent): Secondary | ICD-10-CM | POA: Insufficient documentation

## 2014-09-02 DIAGNOSIS — Z7982 Long term (current) use of aspirin: Secondary | ICD-10-CM | POA: Insufficient documentation

## 2014-09-02 DIAGNOSIS — R519 Headache, unspecified: Secondary | ICD-10-CM

## 2014-09-02 DIAGNOSIS — Z720 Tobacco use: Secondary | ICD-10-CM | POA: Insufficient documentation

## 2014-09-02 HISTORY — DX: Pneumonia, unspecified organism: J18.9

## 2014-09-02 MED ORDER — HYDROCODONE-ACETAMINOPHEN 5-325 MG PO TABS: 1.0000 | ORAL_TABLET | ORAL | Status: DC | PRN

## 2014-09-02 MED ORDER — SODIUM CHLORIDE 0.9 % IV BOLUS (SEPSIS)
1000.0000 mL | Freq: Once | INTRAVENOUS | Status: AC
Start: 2014-09-02 — End: 2014-09-02
  Administered 2014-09-02: 1000 mL via INTRAVENOUS

## 2014-09-02 MED ORDER — HYDROMORPHONE HCL 1 MG/ML IJ SOLN
1.0000 mg | Freq: Once | INTRAMUSCULAR | Status: AC
  Administered 2014-09-02: 1 mg via INTRAVENOUS
  Filled 2014-09-02: qty 1

## 2014-09-02 MED ORDER — DIPHENHYDRAMINE HCL 50 MG/ML IJ SOLN
25.0000 mg | Freq: Once | INTRAMUSCULAR | Status: AC
  Administered 2014-09-02: 25 mg via INTRAVENOUS
  Filled 2014-09-02: qty 1

## 2014-09-02 MED ORDER — MORPHINE SULFATE 4 MG/ML IJ SOLN
4.0000 mg | Freq: Once | INTRAMUSCULAR | Status: AC
  Administered 2014-09-02: 4 mg via INTRAVENOUS
  Filled 2014-09-02: qty 1

## 2014-09-02 MED ORDER — METOCLOPRAMIDE HCL 5 MG/ML IJ SOLN
10.0000 mg | Freq: Once | INTRAMUSCULAR | Status: AC
  Administered 2014-09-02: 10 mg via INTRAVENOUS
  Filled 2014-09-02: qty 2

## 2014-09-02 NOTE — ED Notes (Signed)
Pt request to be out of work for a week. States he has worked for 28 days and he needs time off. Pt made aware that he could not be out of work for a week because of a headache. Pt given two days out of work

## 2014-09-02 NOTE — Discharge Instructions (Signed)
CT scan of your head showed no acute findings. Recommend 2 extra strength Tylenol or 4 ibuprofen over-the-counter tablets for headache. Prescription for pain medicine after other products do not work. Recommend primary care follow-up. Resource guide given.     . Emergency Department Resource Guide 1) Find a Doctor and Pay Out of Pocket Although you won't have to find out who is covered by your insurance plan, it is a good idea to ask around and get recommendations. You will then need to call the office and see if the doctor you have chosen will accept you as a new patient and what types of options they offer for patients who are self-pay. Some doctors offer discounts or will set up payment plans for their patients who do not have insurance, but you will need to ask so you aren't surprised when you get to your appointment.  2) Contact Your Local Health Department Not all health departments have doctors that can see patients for sick visits, but many do, so it is worth a call to see if yours does. If you don't know where your local health department is, you can check in your phone book. The CDC also has a tool to help you locate your state's health department, and many state websites also have listings of all of their local health departments.  3) Find a Walk-in Clinic If your illness is not likely to be very severe or complicated, you may want to try a walk in clinic. These are popping up all over the country in pharmacies, drugstores, and shopping centers. They're usually staffed by nurse practitioners or physician assistants that have been trained to treat common illnesses and complaints. They're usually fairly quick and inexpensive. However, if you have serious medical issues or chronic medical problems, these are probably not your best option.  No Primary Care Doctor: - Call Health Connect at  [phone] - they can help you locate a primary care doctor that  accepts your insurance, provides certain  services, etc. - Physician Referral Service- [phone]  Chronic Pain Problems: Organization         Address  Phone   Notes  [facility]  [phone] Patients need to be referred by their primary care doctor.   Medication Assistance: Organization         Address  Phone   Notes  [facility] Medication [facility] [address] [place] [place], [place] [address] [phone] --Must be a resident of [facility] -- Must have NO insurance coverage whatsoever (no Medicaid/ Medicare, etc.) -- The pt. MUST have a primary care doctor that directs their care regularly and follows them in the community   [facility]ist  [phone]   [employer]  [phone]    Agencies that provide inexpensive medical care: Organization         Address  Phone   Notes  [facility]  [phone]   [facility]    [phone]   [facility] [address] [place], [place] [address] [phone]   [facility] [address] [place] [place] [phone]   Planned Parenthood    [phone]   [facility]    [phone]   [facility]  [address], [place] Phone:  [phone], Fax:  [phone] Hours of Operation:  9 am - 6 pm, M-F.  Also accepts Medicaid/Medicare  and self-pay.  [facility]  [address], [place] Phone: [phone]) [phone], Fax: [phone]) [phone]. Hours of Operation:  8:30 am - 5:30 pm, M-F.  Also accepts Medicaid and self-pay.  [facility] [place] [address], [place] Phone: [phone](336) [phone]   [facility] [address], [place], [place] [phone], Ext. [phone] Mondays & Thursdays: 7-9 AM.  First 15 patients are seen on a first come, first serve basis.    Medicaid-accepting [facility] Providers:  Organization         Address  Phone   Notes  [facility] [address], [place] [phone]) [phone] Also accepts self-pay patients.  [facility] [address], [place]  [phone]) [phone]   [facility] [address], [place] [phone]) [phone]   [facility] [address] [place] [address], [place] [phone]) [phone]   Renaye RakersVeita Bland [address], [place]   [phone] Only accepts [place] Access [place] patients after they have their name applied to their card.   Self-Pay (no insurance) in [facility]:  Organization         Address  Phone   Notes  Sickle Cell Patients, [facility] [address], [place] [phone]) [phone]   [facility] [address], [place] [phone]) [phone]   [facility] [place]  [address], [place] [phone]   [facility]/Dr. Osei-Bonsu  [address] [place] [address], [place] or [phone], [place] [phone]) [phone] Phone number for both [place] and [place] locations is the same.  [facility] [address], [place] [phone]   [facility]  [address] [place] [address], [place] or [address] [phone]   [facility] [address], [place] [phone]) [phone], phone; [phone]) [phone], fax Sees patients 1st and 3rd Saturday of every month.  Must not qualify for public or private insurance (i.e. Medicaid, Medicare, [place] Health Choice, Veterans' Benefits)  Household income should be no more than 200% of the poverty level The clinic cannot treat you if you are pregnant or think you are pregnant  Sexually transmitted diseases are not treated at the clinic.    Dental Care: Organization         Address  Phone  Notes  [facility] of [facility] [address], [place] [phone]) [phone] Accepts  children up to age 59 who are enrolled in [place] or [place] Health Choice; pregnant women with a Medicaid card; and children who have applied for Medicaid or [place] Health Choice, but were declined, whose parents can pay a reduced fee at time of service.  [facility] of [facility] [place]  [address], [place] [phone]) [phone] Accepts children up to age 59 who are enrolled in [place] or [place] Health Choice; pregnant women with a Medicaid card; and children who have applied for Medicaid or [place] Health Choice, but were declined, whose parents can pay a reduced fee at time of service.  [facility]  [address], [place] [phone]) [phone] Patients are seen by appointment only. Walk-ins are not accepted. [facility] will see patients 59 years of age and older. Monday - Tuesday (8am-5pm) Most Wednesdays (  8:30-5pm) $30 per visit, cash only  [facility]  [address], [facility] [phone] Patients are seen by appointment only. Walk-ins are not accepted. [facility] will see patients 63 years of age and older. One Wednesday Evening (Monthly: Volunteer Based).  $30 per visit, cash only  [employer]  [phone] for adults; Children under age 72, call Graduate Pediatric Dentistry at [phone]. Children aged 64-14, please call [phone] to request a pediatric application.  Dental services are provided in all areas of dental care including fillings, crowns and bridges, complete and partial dentures, implants, gum treatment, root canals, and extractions. Preventive care is also provided. Treatment is provided to both adults and children. Patients are selected via a lottery and there is often a waiting list.   [facility] [address] [place]  [phone] [email or web address]   [facility] [address] [place], [place] [phone], Ext. [phone] Second and  Fourth Thursday of each month, opens at 6:30 AM; Clinic ends at 9 AM.  Patients are seen on a first-come first-served basis, and a limited number are seen during each clinic.   [facility]  [address] [place], [place] [phone]   Eligibility Requirements You must have lived in [place], [place], or [place] counties for at least the last three months.   You cannot be eligible for state or federal sponsored [place], including [employer], [place], or [employer].   You generally cannot be eligible for healthcare insurance through your employer.    How to apply: Eligibility screenings are held every Tuesday and Wednesday afternoon from 1:00 pm until 4:00 pm. You do not need an appointment for the interview!  [facility] [address], [place], [place] [phone]   [facility] Health Department  [phone]   [facility] Health Department  [phone]   [facility]  [phone]    Behavioral Health Resources in the Community: Intensive Outpatient Programs Organization         Address  Phone  Notes  [facility] [address] [place], [place] [phone]   [facility] [address], [place], [place] [phone]   [facility] [address] [place], [place]  [phone]   [facility] [address]  [place], [place] [phone] or [phone]   Substance Abuse Resources Organization         Address  Phone  Notes  Al[facility] Drug Services  [phone]   [facility]  [phone]   [facility]  [phone]   Floydene Flock  [phone]   Residential & Outpatient Substance Abuse Program  [phone]   Psychological Services Organization         Address  Phone  Notes  [facility]  [phone]   [facility]  [phone]   [facility]  [address], [place] [phone] or [phone]    Mobile Crisis Teams Organization         Address  Phone  Notes  [facility], Mobile Crisis Care Unit  [phone]   [facility]  [address] [place], [place] [phone]   Doristine Locks [address] [place] [phone]    Self-Help/Support Groups Organization         Address  Phone  Notes  [facility] [place] - variety of support groups  [place] Call for more information  Narcotics Anonymous (NA), [facility] [address], [employer]  2 meetings at this location   Special educational needs teacher         Address  Phone  Notes  [facility] [place],    [place]  [phone]   [facility]  [address] [place] [address]89, [place], [place]   [address], [place] [phone] Admissions: 8am-3pm M-F  Incentives Substance [place] [address],    [place], [place] [phone]   [facility] [address] [place], [place], [place]   [facility] [address],  [place], [place]   [facility] - Intensive Outpatient [address], [place], [place]   [facility] [address]  [place], [place] [phone] or [phone]   [facility] ([facility]) [address], [place], [place] Accepts Medicaid  [facility] [address]  [place] [phone] Substance Abuse/Addiction Treatment   [facility] Organization         Address  Phone  Notes  [facility]  [phone]   Domenic Schwab, PhD [address] [place], [place]   [phone] or [phone]   [place] [address] [place], [place] [phone]   [facility] [phone] [place], [place] [phone] Insurance/Medicaid/sponsorship through [facility] [address], [address]                                    [place], [place] [phone] [place] [address], [place] [phone]    Dr. Adele Schilder  [phone]   [facility] Dept. 1) [address], [place] 2) [place] 3)  [place] [address], [place] [phone]  [phone]   [place] [phone] or [phone] (After Hours)

## 2014-09-02 NOTE — ED Notes (Signed)
Pt reports bilateral leg weakness x2 weeks and headache x1 week. Pt reports light and sound sensitivity. nad noted.

## 2014-09-02 NOTE — ED Provider Notes (Signed)
CSN: [id number]     Arrival date & time 09/02/14  1009 History  This chart was scribed for Donnetta Hutching, MD by Tanda Rockers, ED Scribe. This patient was seen in room APA04/APA04 and the patient's care was started at 10:28 AM.    Chief Complaint  Patient presents with  . Headache   The history is provided by the patient. No language interpreter was used.     HPI Comments: Darrell Parker is a 59 y.o. male who presents to the Emergency Department complaining of a constant headache that began 2 weeks ago. The pain is in a cap like distribution. Pt mentions that the headache is exacerbated while at work. He also complaints of photophobia. He mentions previous headaches in the past that are usually resolved with Tylenol. Pt mentions his right leg gave out earlier but he attributes it to being worn out. Pt was diagnosed with Pneumonia 2 months ago and states he has only had 1 day off of work since then. Denies nausea, vomiting, or any other symptoms.   Past Medical History  Diagnosis Date  . Pneumonia    Past Surgical History  Procedure Laterality Date  . Knee surgery     History reviewed. No pertinent family history. History  Substance Use Topics  . Smoking status: Current Every Day Smoker -- 0.10 packs/day    Types: Cigarettes  . Smokeless tobacco: Not on file  . Alcohol Use: No    Review of Systems  A complete 10 system review of systems was obtained and all systems are negative except as noted in the HPI and PMH.    Allergies  Motrin  Home Medications   Prior to Admission medications   Medication Sig Start Date End Date Taking? Authorizing Provider  aspirin 325 MG tablet Take 325 mg by mouth daily as needed (blood thinner).   Yes Historical Provider, MD  azithromycin (ZITHROMAX Z-PAK) 250 MG tablet Take 1 tablet (250 mg total) by mouth daily. Start on Sunday, February 29th in the morning Patient not taking: Reported on 09/02/2014 06/30/14   Devoria Albe, MD  cyclobenzaprine (FLEXERIL)  10 MG tablet Take 1 tablet (10 mg total) by mouth 2 (two) times daily as needed for muscle spasms. Patient not taking: Reported on 06/29/2014 04/03/14   Janne Napoleon, NP  dextromethorphan-guaiFENesin [facility] DM) 30-600 MG per 12 hr tablet Take 1 tablet by mouth 2 (two) times daily as needed for cough. Patient not taking: Reported on 09/02/2014 06/29/14   Devoria Albe, MD  HYDROcodone-acetaminophen (NORCO) 5-325 MG per tablet Take 1-2 tablets by mouth every 4 (four) hours as needed. 09/02/14   Donnetta Hutching, MD  predniSONE (DELTASONE) 20 MG tablet Take 3 po QD x 3d , then 2 po QD x 3d then 1 po QD x 3d Patient not taking: Reported on 09/02/2014 06/29/14   Devoria Albe, MD  promethazine-codeine [facility] WITH CODEINE) 6.25-10 MG/5ML syrup 10ml at hs for cough Patient not taking: Reported on 09/02/2014 07/01/14   Ivery Quale, PA-C   Triage Vitals: BP 131/73 mmHg  Pulse 83  Temp(Src) 98.2 F (36.8 C) (Oral)  Resp 18  Ht  (1.778 m)  Wt 205 lb (92.987 kg)  BMI 29.41 kg/m2  SpO2 98%   Physical Exam  Constitutional: He is oriented to person, place, and time. He appears well-developed and well-nourished.  HENT:  Head: Normocephalic and atraumatic.  Eyes: Conjunctivae and EOM are normal. Pupils are equal, round, and reactive to light.  Neck: Normal  range of motion. Neck supple.  Cardiovascular: Normal rate, regular rhythm and normal heart sounds.   Pulmonary/Chest: Effort normal and breath sounds normal.  Abdominal: Soft. Bowel sounds are normal.  Musculoskeletal: Normal range of motion.  Neurological: He is alert and oriented to person, place, and time.  Skin: Skin is warm and dry.  Psychiatric: He has a normal mood and affect. His behavior is normal.  Nursing note and vitals reviewed.   ED Course  Procedures (including critical care time)  DIAGNOSTIC STUDIES: Oxygen Saturation is 98% on RA, normal by my interpretation.    COORDINATION OF CARE: 10:34 AM-Discussed treatment plan which includes  CT Head with pt at bedside and pt agreed to plan. Plan to give pt IV Fluids, IV Toradol, IV Benadryl, and IV Reglan.   Labs Review Labs Reviewed - No data to display  Imaging Review Ct Head Wo Contrast  09/02/2014   CLINICAL DATA:  Headache for 2 weeks  EXAM: CT HEAD WITHOUT CONTRAST  TECHNIQUE: Contiguous axial images were obtained from the base of the skull through the vertex without intravenous contrast.  COMPARISON:  None.  FINDINGS: No skull fracture is noted. Paranasal sinuses and mastoid air cells are unremarkable. No intracranial hemorrhage, mass effect or midline shift. No acute cortical infarction. No mass lesion is noted on this unenhanced scan. The gray and white-matter differentiation is preserved. No hydrocephalus. No intra or extra-axial fluid collection.  IMPRESSION: No acute intracranial abnormality.   Electronically Signed   By: Natasha MeadLiviu  Pop M.D.   On: 09/02/2014 11:30     EKG Interpretation None      MDM   Final diagnoses:  Headache, unspecified headache type   No neurological deficits. Patient feels better after IV fluids and pain management. Recommend over-the-counter products. Prescription given for pain medicine if OTCs do not work.  Rx Norco   I personally performed the services described in this documentation, which was scribed in my presence. The recorded information has been reviewed and is accurate.      Donnetta HutchingBrian Ryu Cerreta, MD 09/02/14 1452

## 2014-09-18 ENCOUNTER — Emergency Department (HOSPITAL_COMMUNITY): Payer: Self-pay

## 2014-09-18 ENCOUNTER — Encounter (HOSPITAL_COMMUNITY): Payer: Self-pay | Admitting: *Deleted

## 2014-09-18 ENCOUNTER — Emergency Department (HOSPITAL_COMMUNITY)
Admission: EM | Admit: 2014-09-18 | Discharge: 2014-09-18 | Disposition: A | Discharge status: Home / Self Care | Payer: Self-pay | Attending: Emergency Medicine | Admitting: Emergency Medicine

## 2014-09-18 DIAGNOSIS — Z7982 Long term (current) use of aspirin: Secondary | ICD-10-CM | POA: Insufficient documentation

## 2014-09-18 DIAGNOSIS — W228XXA Striking against or struck by other objects, initial encounter: Secondary | ICD-10-CM | POA: Insufficient documentation

## 2014-09-18 DIAGNOSIS — Z720 Tobacco use: Secondary | ICD-10-CM | POA: Insufficient documentation

## 2014-09-18 DIAGNOSIS — Y9289 Other specified places as the place of occurrence of the external cause: Secondary | ICD-10-CM | POA: Insufficient documentation

## 2014-09-18 DIAGNOSIS — Z8701 Personal history of pneumonia (recurrent): Secondary | ICD-10-CM | POA: Insufficient documentation

## 2014-09-18 DIAGNOSIS — Z79899 Other long term (current) drug therapy: Secondary | ICD-10-CM | POA: Insufficient documentation

## 2014-09-18 DIAGNOSIS — Y9389 Activity, other specified: Secondary | ICD-10-CM | POA: Insufficient documentation

## 2014-09-18 DIAGNOSIS — Y998 Other external cause status: Secondary | ICD-10-CM | POA: Insufficient documentation

## 2014-09-18 DIAGNOSIS — S8991XA Unspecified injury of right lower leg, initial encounter: Secondary | ICD-10-CM | POA: Insufficient documentation

## 2014-09-18 DIAGNOSIS — M25569 Pain in unspecified knee: Secondary | ICD-10-CM

## 2014-09-18 MED ORDER — OXYCODONE-ACETAMINOPHEN 5-325 MG PO TABS
2.0000 | ORAL_TABLET | Freq: Once | ORAL | Status: AC
  Administered 2014-09-18: 2 via ORAL
  Filled 2014-09-18: qty 2

## 2014-09-18 MED ORDER — OXYCODONE-ACETAMINOPHEN 5-325 MG PO TABS: 1.0000 | ORAL_TABLET | ORAL | Status: DC | PRN

## 2014-09-18 NOTE — ED Provider Notes (Signed)
CSN: [id number]     Arrival date & time 09/18/14  1109 History   First MD Initiated Contact with Patient 09/18/14 1218     Chief Complaint  Patient presents with  . Leg Pain     (Consider location/radiation/quality/duration/timing/severity/associated sxs/prior Treatment) HPI  Darrell Parker is a 59 y.o. male who presents to the Emergency Department complaining of right knee pain for one week.  He states that he was cutting a tree limb when the climb "kicked back" and struck the medial knee.  He states the knee has been painful, but was able to bear weight initially.  Pain worsened three days ago after standing on a ladder.  Describes a pulling, tearing pain to the medial knee.  He has tried ice and ibuprofen with minimal relief.  He denies numbness or weakness of the leg, redness, fever, or swelling distal to the knee.  Past Medical History  Diagnosis Date  . Pneumonia    Past Surgical History  Procedure Laterality Date  . Knee surgery     History reviewed. No pertinent family history. History  Substance Use Topics  . Smoking status: Current Every Day Smoker -- 0.10 packs/day    Types: Cigarettes  . Smokeless tobacco: Not on file  . Alcohol Use: No    Review of Systems  Constitutional: Negative for fever and chills.  Musculoskeletal: Positive for joint swelling and arthralgias (right knee pain).  Skin: Negative for color change and wound.  Neurological: Negative for weakness and numbness.  All other systems reviewed and are negative.     Allergies  Motrin  Home Medications   Prior to Admission medications   Medication Sig Start Date End Date Taking? Authorizing Provider  aspirin 325 MG tablet Take 325 mg by mouth daily as needed (blood thinner).   Yes Historical Provider, MD  azithromycin (ZITHROMAX Z-PAK) 250 MG tablet Take 1 tablet (250 mg total) by mouth daily. Start on Sunday, February 29th in the morning Patient not taking: Reported on 09/02/2014 06/30/14   Devoria Albe,  MD  cyclobenzaprine (FLEXERIL) 10 MG tablet Take 1 tablet (10 mg total) by mouth 2 (two) times daily as needed for muscle spasms. Patient not taking: Reported on 06/29/2014 04/03/14   Janne Napoleon, NP  dextromethorphan-guaiFENesin [facility] DM) 30-600 MG per 12 hr tablet Take 1 tablet by mouth 2 (two) times daily as needed for cough. Patient not taking: Reported on 09/02/2014 06/29/14   Devoria Albe, MD  HYDROcodone-acetaminophen (NORCO) 5-325 MG per tablet Take 1-2 tablets by mouth every 4 (four) hours as needed. Patient not taking: Reported on 09/18/2014 09/02/14   Donnetta Hutching, MD  predniSONE (DELTASONE) 20 MG tablet Take 3 po QD x 3d , then 2 po QD x 3d then 1 po QD x 3d Patient not taking: Reported on 09/02/2014 06/29/14   Devoria Albe, MD  promethazine-codeine [facility] WITH CODEINE) 6.25-10 MG/5ML syrup 10ml at hs for cough Patient not taking: Reported on 09/02/2014 07/01/14   Ivery Quale, PA-C   BP 134/88 mmHg  Pulse 82  Temp(Src) 98 F (36.7 C) (Oral)  Resp 16  Ht  (1.778 m)  Wt 205 lb (92.987 kg)  BMI 29.41 kg/m2  SpO2 97% Physical Exam  Constitutional: He is oriented to person, place, and time. He appears well-developed and well-nourished. No distress.  Cardiovascular: Normal rate, regular rhythm, normal heart sounds and intact distal pulses.   No murmur heard. Pulmonary/Chest: Effort normal and breath sounds normal. No respiratory distress.  Musculoskeletal:  He exhibits edema and tenderness.  ttp of the medial right knee. Moderate edema.  No erythema, effusion, or step-off deformity.  DP pulse brisk, distal sensation intact. Calf is soft and NT.  Neurological: He is alert and oriented to person, place, and time. He exhibits normal muscle tone. Coordination normal.  Skin: Skin is warm and dry. No erythema.  Nursing note and vitals reviewed.   ED Course  Procedures (including critical care time) Labs Review Labs Reviewed - No data to display  Imaging Review Mr Knee Right Wo  Contrast  09/18/2014   ADDENDUM REPORT: 09/18/2014 15:23  ADDENDUM: Voice recognition error in the findings section of the report.  In the findings section, the sub section menisci should read as follows :  Medial meniscus: No discrete meniscal tear. Mild increased signal at the posterior meniscocapsular junction which may reflect prior injury, but without separation.   Electronically Signed   By: Elige KoHetal  Patel   On: 09/18/2014 15:23   09/18/2014   CLINICAL DATA:  Right knee pain after being struck by a tree.  EXAM: MRI OF THE RIGHT KNEE WITHOUT CONTRAST  TECHNIQUE: Multiplanar, multisequence MR imaging of the knee was performed. No intravenous contrast was administered.  COMPARISON:  None.  FINDINGS: MENISCI  Medial meniscus:  Severe  Lateral meniscus: Severe attenuation of the body of the lateral meniscus and to a lesser extent the free edge of the posterior horn of the lateral meniscus, which may reflect prior meniscectomy versus tear.  LIGAMENTS  Cruciates:  Intact ACL and PCL.  Collaterals: Medial collateral ligament is intact. Lateral collateral ligament complex is intact.  CARTILAGE  Patellofemoral: Full-thickness cartilage fissure of the patellar apex with a small area of delamination measuring 2 mm. Partial-thickness cartilage loss of the medial and lateral patellofemoral compartments.  Medial: High-grade partial-thickness cartilage loss of the medial femoral condyle and medial tibial plateau.  Lateral: High-grade partial-thickness cartilage loss with areas of full-thickness cartilage loss of the lateral femoral condyle and lateral tibial plateau.  Joint: Small joint effusion. Mild edema in Hoffa's fat. No plical thickening.  Popliteal Fossa:  No Baker cyst.  Intact popliteus tendon.  Extensor Mechanism:  Intact.  Bones: No other focal marrow signal abnormality. Tricompartmental marginal osteophytes most severe in the lateral femorotibial compartment. No fracture or dislocation.  IMPRESSION: 1. Severe  attenuation of the body of the lateral meniscus and to a lesser extent the free edge of the posterior horn of the lateral meniscus, which may reflect prior meniscectomy versus tear. 2. Tricompartmental cartilage abnormalities as described above, most severe in the lateral femorotibial compartment. 3. Small joint effusion.  Electronically Signed: By: Elige KoHetal  Patel On: 09/18/2014 15:02   Dg Knee Complete 4 Views Right  09/18/2014   CLINICAL DATA:  Right knee pain for 2 days following lateral impact from tree limb, initial encounter  EXAM: RIGHT KNEE - COMPLETE 4+ VIEW  COMPARISON:  None.  FINDINGS: Mild degenerative changes are noted with joint space narrowing in all 3 joint compartments. No joint effusion is seen. No acute fracture or dislocation is noted. No gross soft tissue abnormality is seen.  IMPRESSION: Degenerative change without acute abnormality.   Electronically Signed   By: Alcide CleverMark  Lukens M.D.   On: 09/18/2014 13:01     EKG Interpretation None      MDM   Final diagnoses:  Knee pain     1345  Consulted Dr. Hilda LiasKeeling requests pt to have MRI to r/o torn meniscus.    1520  Consulted rads regarding medial meniscus, typo on original result.  Dr. Allena KatzPatel confirmed, no medial meniscal tear.  Pt placed in knee immob and crutches given.  Will f/u with Dr. Hilda LiasKeeling tomorrow in his office.    Darrell Ausammy Mailee Klaas, PA-C 09/19/14 2029  Benjiman CoreNathan Pickering, MD 09/20/14 [phone]

## 2014-09-18 NOTE — ED Notes (Signed)
1 week history of R leg pain that started after a limb from a tree he was cutting hit the lateral aspect of his R knee.  Was able to bear weight on leg, but pain has gotten worse since Sunday.  He has iced it and used ibuprofen for pain.

## 2014-09-18 NOTE — Discharge Instructions (Signed)
Knee Pain °Knee pain can be a result of an injury or other medical conditions. Treatment will depend on the cause of your pain. °HOME CARE °· Only take medicine as told by your doctor. °· Keep a healthy weight. Being overweight can make the knee hurt more. °· Stretch before exercising or playing sports. °· If there is constant knee pain, change the way you exercise. Ask your doctor for advice. °· Make sure shoes fit well. Choose the right shoe for the sport or activity. °· Protect your knees. Wear kneepads if needed. °· Rest when you are tired. °GET HELP RIGHT AWAY IF:  °· Your knee pain does not stop. °· Your knee pain does not get better. °· Your knee joint feels hot to the touch. °· You have a fever. °MAKE SURE YOU:  °· Understand these instructions. °· Will watch this condition. °· Will get help right away if you are not doing well or get worse. °Document Released: 07/16/2008 Document Revised: 07/12/2011 Document Reviewed: 07/16/2008 °[employer]® Patient Information ©2015 [employer]. This information is not intended to replace advice given to you by your health care provider. Make sure you discuss any questions you have with your health care provider. ° °

## 2014-09-24 ENCOUNTER — Encounter (HOSPITAL_COMMUNITY): Payer: Self-pay | Admitting: Cardiology

## 2014-09-24 ENCOUNTER — Emergency Department (HOSPITAL_COMMUNITY)
Admission: EM | Admit: 2014-09-24 | Discharge: 2014-09-24 | Disposition: A | Discharge status: Home / Self Care | Payer: Self-pay | Attending: Emergency Medicine | Admitting: Emergency Medicine

## 2014-09-24 DIAGNOSIS — Z720 Tobacco use: Secondary | ICD-10-CM | POA: Insufficient documentation

## 2014-09-24 DIAGNOSIS — Z792 Long term (current) use of antibiotics: Secondary | ICD-10-CM | POA: Insufficient documentation

## 2014-09-24 DIAGNOSIS — Z79899 Other long term (current) drug therapy: Secondary | ICD-10-CM | POA: Insufficient documentation

## 2014-09-24 DIAGNOSIS — X58XXXA Exposure to other specified factors, initial encounter: Secondary | ICD-10-CM | POA: Insufficient documentation

## 2014-09-24 DIAGNOSIS — M25561 Pain in right knee: Secondary | ICD-10-CM

## 2014-09-24 DIAGNOSIS — Y9389 Activity, other specified: Secondary | ICD-10-CM | POA: Insufficient documentation

## 2014-09-24 DIAGNOSIS — S8991XA Unspecified injury of right lower leg, initial encounter: Secondary | ICD-10-CM | POA: Insufficient documentation

## 2014-09-24 DIAGNOSIS — Y9289 Other specified places as the place of occurrence of the external cause: Secondary | ICD-10-CM | POA: Insufficient documentation

## 2014-09-24 DIAGNOSIS — Z7982 Long term (current) use of aspirin: Secondary | ICD-10-CM | POA: Insufficient documentation

## 2014-09-24 DIAGNOSIS — Z8701 Personal history of pneumonia (recurrent): Secondary | ICD-10-CM | POA: Insufficient documentation

## 2014-09-24 DIAGNOSIS — Y998 Other external cause status: Secondary | ICD-10-CM | POA: Insufficient documentation

## 2014-09-24 MED ORDER — OXYCODONE-ACETAMINOPHEN 5-325 MG PO TABS: 1.0000 | ORAL_TABLET | ORAL | Status: DC | PRN

## 2014-09-24 NOTE — Discharge Instructions (Signed)

## 2014-09-24 NOTE — ED Notes (Signed)
Seen here last week for injury to right leg.  Has appointment with orthopedist 09/27/14.  Is out of pain medicine.  And needs a work note.
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
## 2015-02-14 ENCOUNTER — Emergency Department (HOSPITAL_COMMUNITY): Payer: No Typology Code available for payment source

## 2015-02-14 ENCOUNTER — Encounter (HOSPITAL_COMMUNITY): Payer: Self-pay | Admitting: *Deleted

## 2015-02-14 ENCOUNTER — Emergency Department (HOSPITAL_COMMUNITY)
Admission: EM | Admit: 2015-02-14 | Discharge: 2015-02-14 | Disposition: A | Discharge status: Home / Self Care | Payer: No Typology Code available for payment source | Attending: Emergency Medicine | Admitting: Emergency Medicine

## 2015-02-14 DIAGNOSIS — Z720 Tobacco use: Secondary | ICD-10-CM | POA: Diagnosis not present

## 2015-02-14 DIAGNOSIS — S3992XA Unspecified injury of lower back, initial encounter: Secondary | ICD-10-CM | POA: Diagnosis not present

## 2015-02-14 DIAGNOSIS — S0990XA Unspecified injury of head, initial encounter: Secondary | ICD-10-CM | POA: Insufficient documentation

## 2015-02-14 DIAGNOSIS — S199XXA Unspecified injury of neck, initial encounter: Secondary | ICD-10-CM | POA: Diagnosis present

## 2015-02-14 DIAGNOSIS — Y92410 Unspecified street and highway as the place of occurrence of the external cause: Secondary | ICD-10-CM | POA: Insufficient documentation

## 2015-02-14 DIAGNOSIS — Z8701 Personal history of pneumonia (recurrent): Secondary | ICD-10-CM | POA: Insufficient documentation

## 2015-02-14 DIAGNOSIS — S3991XA Unspecified injury of abdomen, initial encounter: Secondary | ICD-10-CM | POA: Insufficient documentation

## 2015-02-14 DIAGNOSIS — Y998 Other external cause status: Secondary | ICD-10-CM | POA: Insufficient documentation

## 2015-02-14 DIAGNOSIS — M5489 Other dorsalgia: Secondary | ICD-10-CM

## 2015-02-14 DIAGNOSIS — Y9389 Activity, other specified: Secondary | ICD-10-CM | POA: Diagnosis not present

## 2015-02-14 DIAGNOSIS — M542 Cervicalgia: Secondary | ICD-10-CM

## 2015-02-14 MED ORDER — HYDROMORPHONE HCL 1 MG/ML IJ SOLN
0.5000 mg | Freq: Once | INTRAMUSCULAR | Status: AC
  Administered 2015-02-14: 0.5 mg via INTRAVENOUS
  Filled 2015-02-14: qty 1

## 2015-02-14 MED ORDER — HYDROCODONE-ACETAMINOPHEN 5-325 MG PO TABS: 1.0000 | ORAL_TABLET | ORAL | Status: DC | PRN

## 2015-02-14 MED ORDER — CYCLOBENZAPRINE HCL 10 MG PO TABS: 10.0000 mg | ORAL_TABLET | Freq: Two times a day (BID) | ORAL | Status: DC | PRN
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
## 2015-02-14 NOTE — ED Notes (Signed)
MD at bedside. 
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
## 2015-02-14 NOTE — Discharge Instructions (Signed)
X-ray show no broken bones. You need a primary care doctor. Prescriptions for pain and muscle relaxation. Additionally, you have been an unusual area on your thyroid gland which will need further evaluation.    Emergency Department Resource Guide 1) Find a Doctor and Pay Out of Pocket Although you won't have to find out who is covered by your insurance plan, it is a good idea to ask around and get recommendations. You will then need to call the office and see if the doctor you have chosen will accept you as a new patient and what types of options they offer for patients who are self-pay. Some doctors offer discounts or will set up payment plans for their patients who do not have insurance, but you will need to ask so you aren't surprised when you get to your appointment.  2) Contact Your Local Health Department Not all health departments have doctors that can see patients for sick visits, but many do, so it is worth a call to see if yours does. If you don't know where your local health department is, you can check in your phone book. The CDC also has a tool to help you locate your state's health department, and many state websites also have listings of all of their local health departments.  3) Find a Walk-in Clinic If your illness is not likely to be very severe or complicated, you may want to try a walk in clinic. These are popping up all over the country in pharmacies, drugstores, and shopping centers. They're usually staffed by nurse practitioners or physician assistants that have been trained to treat common illnesses and complaints. They're usually fairly quick and inexpensive. However, if you have serious medical issues or chronic medical problems, these are probably not your best option.  No Primary Care Doctor: - Call Health Connect at  [phone] - they can help you locate a primary care doctor that  accepts your insurance, provides certain services, etc. - Physician Referral Service-  [phone]  Chronic Pain Problems: Organization         Address  Phone   Notes  [facility]  [phone] Patients need to be referred by their primary care doctor.   Medication Assistance: Organization         Address  Phone   Notes  [facility] [address] [place], [place] [address] [phone] --Must be a resident of [facility] [place] -- Must have NO insurance coverage whatsoever (no Medicaid/ Medicare, etc.) -- The pt. MUST have a primary care doctor that directs their care regularly and follows them in the community   MedAssist  [phone]   [employer]  [phone]) [phone]    Agencies that provide inexpensive medical care: Organization         Address  Phone   Notes  [facility]  [phone]   [facility]    [phone]   [facility] [address] [place], [place] [address] [phone]) [phone]   [facility] [address], [place] [phone]) [phone]   [facility]anned Parenthood    [phone]   [facility]    [phone]   [facility]  [address], [place] Phone:  [phone], Fax:  [phone] Hours of Operation:  9 am - 6 pm, M-F.  Also accepts Medicaid/Medicare and self-pay.  [facility]  [address], [place] Phone: [phone], Fax: [phone]. Hours of Operation:  8:30 am - 5:30 pm, M-F.  Also accepts Medicaid and self-pay.  [facility] [place] [address] [place] Phone: [phone]   [place], [place], [place] [phone], Ext. [phone] Mondays & Thursdays: 7-9 AM.  First 15 patients are seen on a first come, first serve basis.    [place] Providers:  Organization         Address  Phone   Notes  [facility] [address],  [place] [phone] Also accepts self-pay patients.  [facility] [id number] [address], [place]  [phone]   [place], [address], [place] [phone]   [facility] [address] [place] [phone]   Lucianne Lei [address], [place]   [phone] Only accepts [place] Access [place] patients after they have their name applied to their card.   Self-Pay (no insurance) in [facility]:  Organization         Address  Phone   Notes  Sickle Cell Patients, [facility] [place] [phone]   [facility] Urgent Care [place] [phone]   [facility] [place]  [place], [place], [place] [phone]   [facility]/Dr. Osei-Bonsu  [address] [place] or [address], [place] [phone] Phone number for both [place] and [place] locations is the same.  [facility] [address] [place] [phone]   [facility] [address] [place] or [address] [place] [address] [phone]   [facility] [address] [place] [phone], phone; [phone], fax Sees patients 1st and 3rd Saturday of every month.  Must not qualify for public or private insurance (i.e. Medicaid, Medicare, [place] Health Choice, Veterans' Benefits)  Household income should be no more than 200% of the poverty level The clinic cannot treat you if you are pregnant or think you are pregnant  Sexually transmitted diseases are not treated at the clinic.    Dental Care: Organization         Address  Phone  Notes  [facility] Department of [facility] [phone] Accepts children up to age 64 who are enrolled in  [place] or [place]; pregnant women with a Medicaid card; and children who have applied for Medicaid or [place] Health Choice, but were declined, whose parents can pay a reduced fee at time of service.  [facility] Department of [facility]  [address], [place] [phone] Accepts children up to age 59 who are enrolled in [place] or [place]; pregnant women with a Medicaid card; and children who have applied for Medicaid or [place] Health Choice, but were declined, whose parents can pay a reduced fee at time of service.  [facility]  [place] [phone] Patients are seen by appointment only. Walk-ins are not accepted. [place] will see patients 70 years of age and older. Monday - Tuesday (8am-5pm) Most Wednesdays (8:30-5pm) $30 per visit, cash only  [facility]  [address], [facility] [phone] Patients are seen by appointment only. Walk-ins are not accepted. [place] will see patients 55 years of age and older. One Wednesday Evening (Monthly: Volunteer Based).  $30 per visit, cash only  [place]  [phone] for adults; Children under age 16, call Graduate Pediatric Dentistry at [phone]. Children aged 15-14, please call [phone] to request a pediatric application.  Dental services are provided in all areas of dental care including fillings, crowns and bridges, complete and partial dentures, implants, gum treatment, root canals, and extractions. Preventive care is also provided. Treatment is provided to both adults and children. Patients are selected via a lottery and there is often a waiting list.   [facility] [address] [place]  [phone] [email or web address]   [facility] [address] [place], [place] [phone], Ext. [phone] Second and Fourth Thursday of each month, opens at 6:30  AM; Clinic ends at 9 AM.  Patients are seen on a first-come first-served basis, and a limited number are seen during each clinic.   [facility]  [address] Hillard Danker [place], [place] [phone]   Eligibility Requirements You must have lived in [place], [place], or [place] counties for at least the last three months.   You cannot be eligible for state or federal sponsored [employer], including [employer], [place], or [employer].   You generally cannot be eligible for healthcare insurance through your employer.    How to apply: Eligibility screenings are held every Tuesday and Wednesday afternoon from 1:00 pm until 4:00 pm. You do not need an appointment for the interview!  [facility] [address] [place], [place]   [employer]  [place] Department  [place]  [phone]    Behavioral Health Resources in the Community: Intensive Outpatient Programs Organization         Address  Phone  Notes  [place]. [address], [place], [place] [phone]   [facility] [address], [place], [employer]   ADS: Alcohol & Drug Svcs [address], [place], [place]   [place] [address]  [place], [place] or [phone]   Substance Abuse Resources Organization         Address  Phone  Notes  Alcohol and Drug Services  [phone]   [place]  [phone]   The [place]   Chinita Pester  [phone]   Residential & Outpatient Substance Abuse Program  [phone]   Psychological Services Organization         Address  Phone  Notes  [facility] [place]  [place]  [phone]   [place] [address] [place] [phone] or  [phone]    Mobile Crisis Teams Organization         Address  Phone  Notes  [facility], Mobile Crisis Care Unit  [phone]   [facility]  [address] [place], [place]   [place] [address] [place] [phone]    Self-Help/Support Groups Organization         Address  Phone             Notes  [facility] of [place] - variety of support groups  [place] Call for more information  Narcotics Anonymous (NA), [facility] [address], [employer] [place]  2 meetings at this location   Special educational needs teacher         Address  Phone  Notes  [facility] [place],    [place]  [phone]   [facility]  [address], [place] [phone], [place], [place]   [place], [place] [phone] Admissions: 8am-3pm M-F  Incentives Substance [place] [address]    [place], [place] [phone]   [facility] [address] [place], [place], [place]   [facility] [address]  [place], [place]   [facility] - Intensive Outpatient [address], [place], [place]   [facility] ([place].,  [place], [place] [phone] or [phone]   [facility] (RTS) [address] [place], [place] Accepts Medicaid  [facility] [place] [address],  [place] [phone] Substance Abuse/Addiction Treatment   [facility] Organization         Address  Phone  Notes  [facility]  [phone]   Domenic Schwab, PhD [address] Arlis [place], [place]   [phone] or [phone]   [place], [place] [phone]   [facility] [address],  [place], [place] [phone] Insurance/Medicaid/sponsorship through [facility] and Families [address] [place]                                    [place], [place] [phone] [place] [address], [place] [phone]    Dr. Adele Schilder  [phone]   [facility] Dept. 1) [address], [place] 2) [place] 3)  [place] [address], [place] [phone]  [phone]   [place] [phone] or [phone] (After Hours)

## 2015-02-18 ENCOUNTER — Emergency Department (HOSPITAL_COMMUNITY): Payer: No Typology Code available for payment source

## 2015-02-18 ENCOUNTER — Encounter (HOSPITAL_COMMUNITY): Payer: Self-pay | Admitting: *Deleted

## 2015-02-18 ENCOUNTER — Emergency Department (HOSPITAL_COMMUNITY)
Admission: EM | Admit: 2015-02-18 | Discharge: 2015-02-18 | Disposition: A | Discharge status: Home / Self Care | Payer: No Typology Code available for payment source | Attending: Emergency Medicine | Admitting: Emergency Medicine

## 2015-02-18 DIAGNOSIS — Z720 Tobacco use: Secondary | ICD-10-CM | POA: Diagnosis not present

## 2015-02-18 DIAGNOSIS — Y92481 Parking lot as the place of occurrence of the external cause: Secondary | ICD-10-CM | POA: Diagnosis not present

## 2015-02-18 DIAGNOSIS — Y9389 Activity, other specified: Secondary | ICD-10-CM | POA: Insufficient documentation

## 2015-02-18 DIAGNOSIS — Y998 Other external cause status: Secondary | ICD-10-CM | POA: Insufficient documentation

## 2015-02-18 DIAGNOSIS — S8991XA Unspecified injury of right lower leg, initial encounter: Secondary | ICD-10-CM | POA: Diagnosis not present

## 2015-02-18 DIAGNOSIS — S3992XA Unspecified injury of lower back, initial encounter: Secondary | ICD-10-CM | POA: Diagnosis not present

## 2015-02-18 DIAGNOSIS — R0600 Dyspnea, unspecified: Secondary | ICD-10-CM | POA: Insufficient documentation

## 2015-02-18 DIAGNOSIS — Z8701 Personal history of pneumonia (recurrent): Secondary | ICD-10-CM | POA: Insufficient documentation

## 2015-02-18 DIAGNOSIS — R0602 Shortness of breath: Secondary | ICD-10-CM | POA: Diagnosis present

## 2015-02-18 LAB — CBC WITH DIFFERENTIAL/PLATELET
Basophils Absolute: 0.2 10*3/uL — ABNORMAL HIGH (ref 0.0–0.1)
Basophils Relative: 2 %
Eosinophils Absolute: 0.6 10*3/uL (ref 0.0–0.7)
Eosinophils Relative: 9 %
HEMATOCRIT: 43.8 % (ref 39.0–52.0)
Hemoglobin: 14.7 g/dL (ref 13.0–17.0)
LYMPHS ABS: 1.9 10*3/uL (ref 0.7–4.0)
LYMPHS PCT: 25 %
MCH: 30.2 pg (ref 26.0–34.0)
MCHC: 33.6 g/dL (ref 30.0–36.0)
MCV: 89.9 fL (ref 78.0–100.0)
MONO ABS: 0.6 10*3/uL (ref 0.1–1.0)
MONOS PCT: 9 %
NEUTROS ABS: 4.2 10*3/uL (ref 1.7–7.7)
Neutrophils Relative %: 55 %
Platelets: 261 10*3/uL (ref 150–400)
RBC: 4.87 MIL/uL (ref 4.22–5.81)
RDW: 13.4 % (ref 11.5–15.5)
WBC: 7.5 10*3/uL (ref 4.0–10.5)

## 2015-02-18 LAB — BASIC METABOLIC PANEL
Anion gap: 5 (ref 5–15)
BUN: 8 mg/dL (ref 6–20)
CALCIUM: 8.6 mg/dL — AB (ref 8.9–10.3)
CO2: 24 mmol/L (ref 22–32)
CREATININE: 0.69 mg/dL (ref 0.61–1.24)
Chloride: 107 mmol/L (ref 101–111)
GFR calc Af Amer: >60 mL/min (ref >60)
GFR calc non Af Amer: >60 mL/min (ref >60)
GLUCOSE: 96 mg/dL (ref 65–99)
Potassium: 4.1 mmol/L (ref 3.5–5.1)
Sodium: 136 mmol/L (ref 135–145)

## 2015-02-18 LAB — TROPONIN I: Troponin I: <0.03 ng/mL (ref <0.031)

## 2015-02-18 MED ORDER — CARISOPRODOL 350 MG PO TABS: 350.0000 mg | ORAL_TABLET | Freq: Three times a day (TID) | ORAL | Status: DC

## 2015-02-18 MED ORDER — TRAMADOL HCL 50 MG PO TABS: 50.0000 mg | ORAL_TABLET | Freq: Four times a day (QID) | ORAL | Status: DC | PRN

## 2015-02-18 NOTE — ED Notes (Signed)
Pt c/o not able to breathe well at night while laying down. Hard to take deep breaths at present.  C/o lower back that radiates down right leg

## 2015-02-18 NOTE — ED Notes (Signed)
Attempted EKG x2. Central Monitors not merging with each other. IT contacted.

## 2015-02-18 NOTE — ED Notes (Signed)
EKG completed. RT consulted regarding pt only needing to be charged for one. Per RT, only one EKG is present at this time and would check back later to ensure pt gets charged appropriately.
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
## 2015-03-10 ENCOUNTER — Emergency Department (HOSPITAL_COMMUNITY)
Admission: EM | Admit: 2015-03-10 | Discharge: 2015-03-10 | Disposition: A | Discharge status: Home / Self Care | Payer: No Typology Code available for payment source | Attending: Emergency Medicine | Admitting: Emergency Medicine

## 2015-03-10 ENCOUNTER — Encounter (HOSPITAL_COMMUNITY): Payer: Self-pay | Admitting: Emergency Medicine

## 2015-03-10 DIAGNOSIS — Z79899 Other long term (current) drug therapy: Secondary | ICD-10-CM | POA: Insufficient documentation

## 2015-03-10 DIAGNOSIS — Z720 Tobacco use: Secondary | ICD-10-CM | POA: Insufficient documentation

## 2015-03-10 DIAGNOSIS — Z8701 Personal history of pneumonia (recurrent): Secondary | ICD-10-CM | POA: Diagnosis not present

## 2015-03-10 DIAGNOSIS — M5431 Sciatica, right side: Secondary | ICD-10-CM | POA: Diagnosis not present

## 2015-03-10 DIAGNOSIS — M545 Low back pain: Secondary | ICD-10-CM | POA: Insufficient documentation

## 2015-03-10 DIAGNOSIS — M25551 Pain in right hip: Secondary | ICD-10-CM | POA: Diagnosis present

## 2015-03-10 MED ORDER — METHOCARBAMOL 500 MG PO TABS: 500.0000 mg | ORAL_TABLET | Freq: Three times a day (TID) | ORAL | Status: DC

## 2015-03-10 MED ORDER — OXYCODONE-ACETAMINOPHEN 5-325 MG PO TABS: 1.0000 | ORAL_TABLET | ORAL | Status: DC | PRN

## 2015-03-10 NOTE — Discharge Instructions (Signed)
Radicular Pain °Radicular pain in either the arm or leg is usually from a bulging or herniated disk in the spine. A piece of the herniated disk may press against the nerves as the nerves exit the spine. This causes pain which is felt at the tips of the nerves down the arm or leg. Other causes of radicular pain may include: °· Fractures. °· Heart disease. °· Cancer. °· An abnormal and usually degenerative state of the nervous system or nerves (neuropathy). °Diagnosis may require CT or MRI scanning to determine the primary cause.  °Nerves that start at the neck (nerve roots) may cause radicular pain in the outer shoulder and arm. It can spread down to the thumb and fingers. The symptoms vary depending on which nerve root has been affected. In most cases radicular pain improves with conservative treatment. Neck problems may require physical therapy, a neck collar, or cervical traction. Treatment may take many weeks, and surgery may be considered if the symptoms do not improve.  °Conservative treatment is also recommended for sciatica. Sciatica causes pain to radiate from the lower back or buttock area down the leg into the foot. Often there is a history of back problems. Most patients with sciatica are better after 2 to 4 weeks of rest and other supportive care. Short term bed rest can reduce the disk pressure considerably. Sitting, however, is not a good position since this increases the pressure on the disk. You should avoid bending, lifting, and all other activities which make the problem worse. Traction can be used in severe cases. Surgery is usually reserved for patients who do not improve within the first months of treatment. °Only take over-the-counter or prescription medicines for pain, discomfort, or fever as directed by your caregiver. Narcotics and muscle relaxants may help by relieving more severe pain and spasm and by providing mild sedation. Cold or massage can give significant relief. Spinal manipulation  is not recommended. It can increase the degree of disc protrusion. Epidural steroid injections are often effective treatment for radicular pain. These injections deliver medicine to the spinal nerve in the space between the protective covering of the spinal cord and back bones (vertebrae). Your caregiver can give you more information about steroid injections. These injections are most effective when given within two weeks of the onset of pain.  °You should see your caregiver for follow up care as recommended. A program for neck and back injury rehabilitation with stretching and strengthening exercises is an important part of management.  °SEEK IMMEDIATE MEDICAL CARE IF: °· You develop increased pain, weakness, or numbness in your arm or leg. °· You develop difficulty with bladder or bowel control. °· You develop abdominal pain. °  °This information is not intended to replace advice given to you by your health care provider. Make sure you discuss any questions you have with your health care provider. °  °Document Released: 05/27/2004 Document Revised: 05/10/2014 Document Reviewed: 11/13/2014 °Elsevier Interactive Patient Education ©2016 [employer]vier [employer]. ° °
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
## 2015-05-01 ENCOUNTER — Emergency Department (HOSPITAL_COMMUNITY): Payer: Self-pay

## 2015-05-01 ENCOUNTER — Emergency Department (HOSPITAL_COMMUNITY)
Admission: EM | Admit: 2015-05-01 | Discharge: 2015-05-01 | Disposition: A | Discharge status: Home / Self Care | Payer: Self-pay | Attending: Emergency Medicine | Admitting: Emergency Medicine

## 2015-05-01 ENCOUNTER — Encounter (HOSPITAL_COMMUNITY): Payer: Self-pay | Admitting: Emergency Medicine

## 2015-05-01 DIAGNOSIS — Y9289 Other specified places as the place of occurrence of the external cause: Secondary | ICD-10-CM | POA: Insufficient documentation

## 2015-05-01 DIAGNOSIS — Z8701 Personal history of pneumonia (recurrent): Secondary | ICD-10-CM | POA: Insufficient documentation

## 2015-05-01 DIAGNOSIS — Y998 Other external cause status: Secondary | ICD-10-CM | POA: Insufficient documentation

## 2015-05-01 DIAGNOSIS — M25512 Pain in left shoulder: Secondary | ICD-10-CM

## 2015-05-01 DIAGNOSIS — Z8669 Personal history of other diseases of the nervous system and sense organs: Secondary | ICD-10-CM | POA: Insufficient documentation

## 2015-05-01 DIAGNOSIS — Z79899 Other long term (current) drug therapy: Secondary | ICD-10-CM | POA: Insufficient documentation

## 2015-05-01 DIAGNOSIS — Y9389 Activity, other specified: Secondary | ICD-10-CM | POA: Insufficient documentation

## 2015-05-01 DIAGNOSIS — S4992XA Unspecified injury of left shoulder and upper arm, initial encounter: Secondary | ICD-10-CM | POA: Insufficient documentation

## 2015-05-01 DIAGNOSIS — W11XXXA Fall on and from ladder, initial encounter: Secondary | ICD-10-CM | POA: Insufficient documentation

## 2015-05-01 DIAGNOSIS — F17210 Nicotine dependence, cigarettes, uncomplicated: Secondary | ICD-10-CM | POA: Insufficient documentation

## 2015-05-01 HISTORY — DX: Unspecified hearing loss, unspecified ear: H91.90

## 2015-05-01 MED ORDER — METHOCARBAMOL 500 MG PO TABS
500.0000 mg | ORAL_TABLET | Freq: Once | ORAL | Status: AC
  Administered 2015-05-01: 500 mg via ORAL
  Filled 2015-05-01: qty 1

## 2015-05-01 MED ORDER — METHOCARBAMOL 500 MG PO TABS: 500.0000 mg | ORAL_TABLET | Freq: Three times a day (TID) | ORAL | Status: DC | PRN

## 2015-05-01 NOTE — ED Notes (Signed)
Pt made aware to return if symptoms worsen or if any life threatening symptoms occur.   

## 2015-05-01 NOTE — ED Notes (Signed)
PT c/o left shoulder pain after bracing himself from a fall and a ladder fell onto his left shoulder x3 weeks ago. PT states greater pain with lifting and has active ROM.

## 2015-05-01 NOTE — ED Notes (Signed)
MD at bedside. 
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
## 2015-12-17 ENCOUNTER — Emergency Department (HOSPITAL_COMMUNITY)
Admission: EM | Admit: 2015-12-17 | Discharge: 2015-12-17 | Disposition: A | Discharge status: Home / Self Care | Payer: Self-pay | Attending: Emergency Medicine | Admitting: Emergency Medicine

## 2015-12-17 ENCOUNTER — Encounter (HOSPITAL_COMMUNITY): Payer: Self-pay | Admitting: Cardiology

## 2015-12-17 ENCOUNTER — Emergency Department (HOSPITAL_COMMUNITY): Payer: Self-pay

## 2015-12-17 DIAGNOSIS — M25461 Effusion, right knee: Secondary | ICD-10-CM | POA: Insufficient documentation

## 2015-12-17 DIAGNOSIS — M13861 Other specified arthritis, right knee: Secondary | ICD-10-CM | POA: Insufficient documentation

## 2015-12-17 DIAGNOSIS — M1710 Unilateral primary osteoarthritis, unspecified knee: Secondary | ICD-10-CM

## 2015-12-17 DIAGNOSIS — F17210 Nicotine dependence, cigarettes, uncomplicated: Secondary | ICD-10-CM | POA: Insufficient documentation

## 2015-12-17 MED ORDER — OXYCODONE-ACETAMINOPHEN 5-325 MG PO TABS: 2.0000 | ORAL_TABLET | ORAL | 0 refills | Status: DC | PRN

## 2015-12-17 NOTE — ED Triage Notes (Signed)
C/o knee pain in right leg, felt a "pop" at work and has hurt since

## 2015-12-17 NOTE — ED Notes (Signed)
Pt to xray at this time.

## 2015-12-17 NOTE — ED Notes (Signed)
Pt returned from xray at this time.

## 2015-12-18 NOTE — ED Provider Notes (Signed)
[facility]-EMERGENCY DEPT Provider Note   CSN: [id number] Arrival date & time: 12/17/15  1142     History   Chief Complaint Chief Complaint  Patient presents with  . Leg Pain    knee    HPI Darrell Parker is a 60 y.o. male presenting with increased pain and swelling of his right knee which he had a ligament repair [place] with intermittent flares of pain and swelling.  He was coming down a ladder yesterday and when he reached the ground, he mistakenly planted and twisted, causing increased pain and swelling in the knee.  He endorses having to have his knee drained several times in the past when this has become severe.  [facility] and is scheduled to see him in 12 days.  In the interim he is having pain with weight bearing and flexion.  He is able to extend the knee without difficulty.  He denies radiation of pain, numbness or weakness in the leg. He has used ice and rest prior to arrival here.   The history is provided by the patient.    Past Medical History:  Diagnosis Date  . Hearing deficit   . Pneumonia     There are no active problems to display for this patient.   Past Surgical History:  Procedure Laterality Date  . KNEE SURGERY         Home Medications    Prior to Admission medications   Medication Sig Start Date End Date Taking? Authorizing Provider  carisoprodol (SOMA) 350 MG tablet Take 1 tablet (350 mg total) by mouth 3 (three) times daily. Patient not taking: Reported on 12/17/2015 02/18/15   Linwood DibblesJon Knapp, MD  methocarbamol (ROBAXIN) 500 MG tablet Take 1 tablet (500 mg total) by mouth every 8 (eight) hours as needed for muscle spasms. Patient not taking: Reported on 12/17/2015 05/01/15   Marily MemosJason Mesner, MD  oxyCODONE-acetaminophen (PERCOCET/ROXICET) 5-325 MG tablet Take 2 tablets by mouth every 4 (four) hours as needed for severe pain. 12/17/15   Burgess AmorJulie Gustin Zobrist, PA-C  traMADol (ULTRAM) 50 MG tablet Take 1 tablet (50  mg total) by mouth every 6 (six) hours as needed. Patient not taking: Reported on 12/17/2015 02/18/15   Linwood DibblesJon Knapp, MD    Family History Family History  Problem Relation Age of Onset  . Kidney failure Mother   . Heart attack Mother     Social History Social History  Substance Use Topics  . Smoking status: Current Every Day Smoker    Packs/day: 0.50    Types: Cigarettes  . Smokeless tobacco: Never Used  . Alcohol use No     Allergies   Motrin [ibuprofen] and Tylenol [acetaminophen]   Review of Systems Review of Systems  Constitutional: Negative for fever.  Musculoskeletal: Positive for arthralgias and joint swelling. Negative for myalgias.  Neurological: Negative for weakness and numbness.     Physical Exam Updated Vital Signs BP 131/82 (BP Location: Right Arm)   Pulse 64   Temp 98.6 F (37 C) (Oral)   Resp 16   Ht 5' 9.5" (1.765 m)   Wt 99.8 kg   SpO2 99%   BMI 32.02 kg/m   Physical Exam  Constitutional: He appears well-developed and well-nourished.  HENT:  Head: Atraumatic.  Neck: Normal range of motion.  Cardiovascular:  Pulses:      Dorsalis pedis pulses are 2+ on the right side, and 2+ on the left side.  Pulses  equal bilaterally  Musculoskeletal: He exhibits edema and tenderness.       Right knee: He exhibits decreased range of motion and swelling. He exhibits no effusion, no deformity, no erythema, no LCL laxity and no MCL laxity. Tenderness found. Medial joint line and lateral joint line tenderness noted.  Crepitus with extension, limited flexion.  Visible edema of the right knee without ballotable effusion.   Neurological: He is alert. He has normal strength. He displays normal reflexes. No sensory deficit.  Skin: Skin is warm and dry.  Psychiatric: He has a normal mood and affect.     ED Treatments / Results  Labs (all labs ordered are listed, but only abnormal results are displayed) Labs Reviewed - No data to display  EKG  EKG  Interpretation None       Radiology   Dg Knee Complete 4 Views Right  Result Date: 12/17/2015 CLINICAL DATA:  Increasing pain and swelling after a twisting injury 1 week ago. EXAM: RIGHT KNEE - COMPLETE 4+ VIEW COMPARISON:  Radiographs dated 09/18/2014 FINDINGS: There is no acute fracture or dislocation. Minimal joint effusion. Tricompartmental osteoarthritis, most prominent in the patellofemoral compartment. No significant change since the prior exam. IMPRESSION: No acute bone abnormalities. Minimal joint effusion. Tricompartmental osteoarthritis. Electronically Signed   By: Francene BoyersJames  Maxwell M.D.   On: 12/17/2015 13:24     Procedures Procedures (including critical care time)  Medications Ordered in ED Medications - No data to display   Initial Impression / Assessment and Plan / ED Course  I have reviewed the triage vital signs and the nursing notes.  Pertinent labs & imaging results that were available during my care of the patient were reviewed by me and considered in my medical decision making (see chart for details).  Clinical Course    Imaging reviewed.  Minimal joint effusion. Ace wrap.  Crutches offered but pt deferred. Advised may continue ice therapy.  F/u with ortho as planned.    Final Clinical Impressions(s) / ED Diagnoses   Final diagnoses:  Knee effusion, right  Arthritis of knee    New Prescriptions Discharge Medication List as of 12/17/2015  1:47 PM       Burgess AmorJulie Rayssa Atha, PA-C 12/18/15 [id number]    Samuel JesterKathleen McManus, DO 12/20/15 1444

## 2017-11-01 ENCOUNTER — Encounter (HOSPITAL_COMMUNITY): Payer: Self-pay | Admitting: Emergency Medicine

## 2017-11-01 ENCOUNTER — Emergency Department (HOSPITAL_COMMUNITY): Payer: Self-pay

## 2017-11-01 ENCOUNTER — Emergency Department (HOSPITAL_COMMUNITY)
Admission: EM | Admit: 2017-11-01 | Discharge: 2017-11-01 | Disposition: A | Discharge status: Home / Self Care | Payer: Self-pay | Attending: Emergency Medicine | Admitting: Emergency Medicine

## 2017-11-01 ENCOUNTER — Other Ambulatory Visit: Payer: Self-pay

## 2017-11-01 DIAGNOSIS — W208XXA Other cause of strike by thrown, projected or falling object, initial encounter: Secondary | ICD-10-CM | POA: Insufficient documentation

## 2017-11-01 DIAGNOSIS — S1093XA Contusion of unspecified part of neck, initial encounter: Secondary | ICD-10-CM | POA: Insufficient documentation

## 2017-11-01 DIAGNOSIS — Y929 Unspecified place or not applicable: Secondary | ICD-10-CM | POA: Insufficient documentation

## 2017-11-01 DIAGNOSIS — F17210 Nicotine dependence, cigarettes, uncomplicated: Secondary | ICD-10-CM | POA: Insufficient documentation

## 2017-11-01 DIAGNOSIS — Y999 Unspecified external cause status: Secondary | ICD-10-CM | POA: Insufficient documentation

## 2017-11-01 DIAGNOSIS — Y939 Activity, unspecified: Secondary | ICD-10-CM | POA: Insufficient documentation

## 2017-11-01 MED ORDER — METHOCARBAMOL 500 MG PO TABS: 500.0000 mg | ORAL_TABLET | Freq: Three times a day (TID) | ORAL | Status: DC

## 2017-11-01 MED ORDER — METHOCARBAMOL 500 MG PO TABS
500.0000 mg | ORAL_TABLET | Freq: Once | ORAL | Status: AC
  Administered 2017-11-01: 500 mg via ORAL
  Filled 2017-11-01: qty 1

## 2017-11-01 MED ORDER — METHOCARBAMOL 500 MG PO TABS: 500.0000 mg | ORAL_TABLET | Freq: Two times a day (BID) | ORAL | 0 refills | Status: DC

## 2017-11-01 NOTE — ED Triage Notes (Addendum)
Patient states he was at work and a pipe fell on the upper part of his back knocking him down. States he went home and has started having pain to the area with movement. States his boss made him come to ER and he must have a doctors note to return to work.

## 2017-11-01 NOTE — Discharge Instructions (Addendum)
Return if any problems.  Schedule to see Dr. Romeo AppleHarrison for recheck if pain persist past one week

## 2017-11-03 NOTE — ED Provider Notes (Signed)
[facility] EMERGENCY DEPARTMENT Provider Note   CSN: [id number] Arrival date & time: 11/01/17  1811     History   Chief Complaint Chief Complaint  Patient presents with  . Back Injury    HPI Darrell Parker is a 62 y.o. male.  Pt reports a piece of metal pipe fell on the back of his neck.  Pt has soreness in lower neck and upper back   The history is provided by the patient. No language interpreter was used.  Neck Injury  This is a new problem. The current episode started 3 to 5 hours ago. The problem occurs constantly. The problem has been gradually worsening. Pertinent negatives include no headaches and no shortness of breath. Nothing aggravates the symptoms. The symptoms are relieved by medications. He has tried nothing for the symptoms.    Past Medical History:  Diagnosis Date  . Hearing deficit   . Pneumonia     There are no active problems to display for this patient.   Past Surgical History:  Procedure Laterality Date  . KNEE SURGERY          Home Medications    Prior to Admission medications   Medication Sig Start Date End Date Taking? Authorizing Provider  methocarbamol (ROBAXIN) 500 MG tablet Take 1 tablet (500 mg total) by mouth 2 (two) times daily. 11/01/17   Elson Areas, PA-C  oxyCODONE-acetaminophen (PERCOCET/ROXICET) 5-325 MG tablet Take 2 tablets by mouth every 4 (four) hours as needed for severe pain. 12/17/15   Burgess Amor, PA-C  traMADol (ULTRAM) 50 MG tablet Take 1 tablet (50 mg total) by mouth every 6 (six) hours as needed. Patient not taking: Reported on 12/17/2015 02/18/15   Linwood Dibbles, MD    Family History Family History  Problem Relation Age of Onset  . Kidney failure Mother   . Heart attack Mother     Social History Social History   Tobacco Use  . Smoking status: Current Every Day Smoker    Packs/day: 0.50    Types: Cigarettes  . Smokeless tobacco: Never Used  Substance Use Topics  . Alcohol use: No  . Drug use: No      Allergies   Motrin [ibuprofen] and Tylenol [acetaminophen]   Review of Systems Review of Systems  Respiratory: Negative for shortness of breath.   Neurological: Negative for headaches.  All other systems reviewed and are negative.    Physical Exam Updated Vital Signs BP 139/75 (BP Location: Right Arm)   Pulse 91   Temp 98.2 F (36.8 C) (Oral)   Resp 15   Ht 5\' 10"  (1.778 m)   Wt 97.5 kg (215 lb)   SpO2 99%   BMI 30.85 kg/m   Physical Exam  Constitutional: He is oriented to person, place, and time. He appears well-developed and well-nourished.  HENT:  Head: Normocephalic.  Right Ear: External ear normal.  Left Ear: External ear normal.  Eyes: EOM are normal.  Neck: Normal range of motion.  Tender c7-t2 and upper back,  No deformity  Cardiovascular: Normal rate.  Pulmonary/Chest: Effort normal.  Abdominal: He exhibits no distension.  Neurological: He is alert and oriented to person, place, and time.  Skin: Skin is warm.  Psychiatric: He has a normal mood and affect.  Nursing note and vitals reviewed.    ED Treatments / Results  Labs (all labs ordered are listed, but only abnormal results are displayed) Labs Reviewed - No data to display  EKG None  Radiology  Dg Cervical Spine Complete  Result Date: 11/01/2017 CLINICAL DATA:  Pain following injury EXAM: CERVICAL SPINE - COMPLETE 4+ VIEW COMPARISON:  Cervical spine CT February 14, 2015 FINDINGS: Frontal, lateral, open-mouth odontoid, and bilateral oblique views were obtained. On swimmer's views, there is apparent consent artifact. There is no demonstrable fracture or spondylolisthesis. Prevertebral soft tissues and predental space regions are normal. There is moderately severe disc space narrowing at C5-6 and C6-7. There are anterior osteophytes at C5, C6, and C7. There is facet hypertrophy with exit foraminal narrowing at C6-7 and C7-T1 bilaterally as well as on the right at C3-4. Lung apices are clear.  IMPRESSION: Osteoarthritic change at several levels. No evident fracture or spondylolisthesis. Electronically Signed   By: Bretta BangWilliam  Woodruff III M.D.   On: 11/01/2017 19:56   Dg Thoracic Spine 2 View  Result Date: 11/01/2017 CLINICAL DATA:  Pain following trauma EXAM: THORACIC SPINE 2 VIEWS COMPARISON:  Chest radiograph February 18, 2015 FINDINGS: Frontal and lateral views were obtained. No evident fracture or spondylolisthesis. There is slight disc space narrowing at several levels. No erosive change or paraspinous lesion. There is slight lower thoracic levoscoliosis. IMPRESSION: Mild scoliosis. Mild osteoarthritic change at several levels. No fracture or spondylolisthesis. Electronically Signed   By: Bretta BangWilliam  Woodruff III M.D.   On: 11/01/2017 19:57    Procedures Procedures (including critical care time)  Medications Ordered in ED Medications  methocarbamol (ROBAXIN) tablet 500 mg (500 mg Oral Given 11/01/17 2013)     Initial Impression / Assessment and Plan / ED Course  I have reviewed the triage vital signs and the nursing notes.  Pertinent labs & imaging results that were available during my care of the patient were reviewed by me and considered in my medical decision making (see chart for details).     MDM  Xray normal.  Pt request a muscle relaxer.  Pt advised to return if any problems.   Final Clinical Impressions(s) / ED Diagnoses   Final diagnoses:  Contusion of neck, initial encounter    ED Discharge Orders        Ordered    methocarbamol (ROBAXIN) 500 MG tablet  2 times daily     11/01/17 2012    An After Visit Summary was printed and given to the patient.    Elson AreasSofia, Catalena Stanhope K, [place]PA-C 11/03/17 1739    Mesner, Barbara CowerJason, MD 11/04/17 [phone]

## 2017-11-25 ENCOUNTER — Encounter (HOSPITAL_COMMUNITY): Payer: Self-pay | Admitting: Emergency Medicine

## 2017-11-25 ENCOUNTER — Emergency Department (HOSPITAL_COMMUNITY): Payer: Self-pay

## 2017-11-25 ENCOUNTER — Other Ambulatory Visit: Payer: Self-pay

## 2017-11-25 ENCOUNTER — Emergency Department (HOSPITAL_COMMUNITY)
Admission: EM | Admit: 2017-11-25 | Discharge: 2017-11-25 | Disposition: A | Discharge status: Home / Self Care | Payer: Self-pay | Attending: Emergency Medicine | Admitting: Emergency Medicine

## 2017-11-25 DIAGNOSIS — F17210 Nicotine dependence, cigarettes, uncomplicated: Secondary | ICD-10-CM | POA: Insufficient documentation

## 2017-11-25 DIAGNOSIS — M62830 Muscle spasm of back: Secondary | ICD-10-CM | POA: Insufficient documentation

## 2017-11-25 DIAGNOSIS — Z79899 Other long term (current) drug therapy: Secondary | ICD-10-CM | POA: Insufficient documentation

## 2017-11-25 DIAGNOSIS — M545 Low back pain, unspecified: Secondary | ICD-10-CM

## 2017-11-25 MED ORDER — DEXAMETHASONE SODIUM PHOSPHATE 10 MG/ML IJ SOLN
10.0000 mg | Freq: Once | INTRAMUSCULAR | Status: AC
  Administered 2017-11-25: 10 mg via INTRAMUSCULAR
  Filled 2017-11-25: qty 1

## 2017-11-25 MED ORDER — PREDNISONE 20 MG PO TABS: ORAL_TABLET | ORAL | 0 refills | Status: DC

## 2017-11-25 MED ORDER — DIAZEPAM 5 MG PO TABS
5.0000 mg | ORAL_TABLET | Freq: Once | ORAL | Status: AC
  Administered 2017-11-25: 5 mg via ORAL
  Filled 2017-11-25: qty 1

## 2017-11-25 MED ORDER — CYCLOBENZAPRINE HCL 5 MG PO TABS: ORAL_TABLET | ORAL | 0 refills | Status: DC

## 2017-11-25 NOTE — ED Provider Notes (Signed)
[facility] EMERGENCY DEPARTMENT Provider Note   CSN: [id number] Arrival date & time: 11/25/17  0449  Time seen 05:47 AM   History   Chief Complaint Chief Complaint  Patient presents with  . Back Pain    HPI Darrell Parker is a 62 y.o. male.  HPI patient states yesterday evening about 6 PM he had a blowout while driving home.  He put his car up on a jack and states the land was a little bit on level and he checked it and thought the car was stable.  He took off the tire and then he went to the trunk and had his hands underneath the spare tire and was getting ready to pick up the tire when the whole car fell about 10 to 11 inches off the jack.  He states he was jerked forward and he had some pain and felt like he "got punched in the back" and then he had some soreness.  He states he went home and took a cold bath and then laid in his recliner with a pillow behind his back which seemed to help however when he went to bed he could not sleep.  He states the pain radiates up his back into his right shoulder blade and right shoulder area.  He states movement makes it worse.  He denies any pain radiating into his legs or incontinence.  He states he is never had this pain before.  He states all his pain is mainly on the right side.  He describes the pain as throbbing and a grabbing sensation.  Patient was also seen in the ED on July 2 when he got hit in the neck by a pipe that fell.  PCP Patient, No Pcp Per   Past Medical History:  Diagnosis Date  . Hearing deficit   . Pneumonia     There are no active problems to display for this patient.   Past Surgical History:  Procedure Laterality Date  . KNEE SURGERY          Home Medications    none  Prior to Admission medications   Medication Sig Start Date End Date Taking? Authorizing Provider  cyclobenzaprine (FLEXERIL) 5 MG tablet Take 1 or 2 po Q 6hrs for pain 11/25/17   Devoria Albe, MD  methocarbamol (ROBAXIN) 500 MG tablet Take 1  tablet (500 mg total) by mouth 2 (two) times daily. 11/01/17   Elson Areas, PA-C  oxyCODONE-acetaminophen (PERCOCET/ROXICET) 5-325 MG tablet Take 2 tablets by mouth every 4 (four) hours as needed for severe pain. 12/17/15   Idol, Raynelle Fanning, PA-C  predniSONE (DELTASONE) 20 MG tablet Take 3 po QD x 3d , then 2 po QD x 3d then 1 po QD x 3d 11/25/17   Devoria Albe, MD  traMADol (ULTRAM) 50 MG tablet Take 1 tablet (50 mg total) by mouth every 6 (six) hours as needed. Patient not taking: Reported on 12/17/2015 02/18/15   Linwood Dibbles, MD    Family History Family History  Problem Relation Age of Onset  . Kidney failure Mother   . Heart attack Mother     Social History Social History   Tobacco Use  . Smoking status: Current Every Day Smoker    Packs/day: 0.50    Types: Cigarettes  . Smokeless tobacco: Never Used  Substance Use Topics  . Alcohol use: No  . Drug use: No  widowed Smokes 2 ppweek   Allergies   Motrin [ibuprofen] and Tylenol [acetaminophen]  Review of Systems Review of Systems  All other systems reviewed and are negative.    Physical Exam Updated Vital Signs BP (!) 171/96 (BP Location: Left Arm)   Pulse 89   Temp 98 F (36.7 C)   Resp 18   Ht 5\' 10"  (1.778 m)   Wt 97.5 kg (215 lb)   SpO2 100%   BMI 30.85 kg/m   Vital signs normal except hypertension   Physical Exam  Constitutional: He is oriented to person, place, and time. He appears well-developed and well-nourished.  HENT:  Head: Normocephalic and atraumatic.  Right Ear: External ear normal.  Left Ear: External ear normal.  Nose: Nose normal.  Eyes: Conjunctivae and EOM are normal.  Cardiovascular: Normal rate.  Pulmonary/Chest: Effort normal. No respiratory distress.  Musculoskeletal: Normal range of motion.       Back:  Patient is painful in the right paraspinous muscles.  When he does lumbar range of motion it hurts worse to bend to the left than the right although the right is uncomfortable.  His  reflexes are 1+ and equal bilaterally.  He does not have straight leg raising.  Neurological: He is alert and oriented to person, place, and time. No cranial nerve deficit.  Skin: Skin is warm and dry.  Psychiatric: He has a normal mood and affect. His behavior is normal. Thought content normal.  Nursing note and vitals reviewed.    ED Treatments / Results  Labs (all labs ordered are listed, but only abnormal results are displayed) Labs Reviewed - No data to display  EKG None  Radiology Dg Lumbar Spine 2-3 Views  Result Date: 11/25/2017 CLINICAL DATA:  Back pain after injury while changing car tire. EXAM: LUMBAR SPINE - 2-3 VIEW COMPARISON:  Lumbar spine radiographs 02/14/2015 FINDINGS: There is no evidence of lumbar spine fracture. Unchanged grade 1 anterolisthesis at L4-L5. Intervertebral disc spaces are maintained. Unchanged L4 limbus vertebra. IMPRESSION: No acute abnormality of the lumbar spine. Electronically Signed   By: Deatra RobinsonKevin  Herman M.D.   On: 11/25/2017 06:27    Procedures Procedures (including critical care time)  Medications Ordered in ED Medications  diazepam (VALIUM) tablet 5 mg (5 mg Oral Given 11/25/17 [id number])  dexamethasone (DECADRON) injection 10 mg (10 mg Intramuscular Given 11/25/17 0609)     Initial Impression / Assessment and Plan / ED Course  I have reviewed the triage vital signs and the nursing notes.  Pertinent labs & imaging results that were available during my care of the patient were reviewed by me and considered in my medical decision making (see chart for details).     Patient had questionable trauma with the car falling out from under him while he was trying to pick up a tire.  X-ray was done to make sure he did not have a compression fracture.  He was given Decadron IM and oral Valium due to Scientist, clinical (histocompatibility and immunogenetics)nationwide shortage.  Patient is allergic to Motrin.  Recheck at 06:50 AM states he is starting to feel better. Informed his xray was good, no compression  fracture. Wants a work note for today, Friday, then he is off until Monday.   Final Clinical Impressions(s) / ED Diagnoses   Final diagnoses:  Acute right-sided low back pain without sciatica  Muscle spasm of back    ED Discharge Orders        Ordered    predniSONE (DELTASONE) 20 MG tablet     11/25/17 0707    cyclobenzaprine (FLEXERIL) 5 MG tablet  11/25/17 1610      Plan discharge  Devoria Albe, MD, Concha Pyo, MD 11/25/17 [phone]

## 2017-11-25 NOTE — ED Triage Notes (Signed)
Pt c/o lower back pain since changing tire yesterday.

## 2017-11-25 NOTE — Discharge Instructions (Addendum)
Use ice and heat for comfort.. Take the medication as prescribed. Recheck if not improving over the next week or it gets worse.

## 2017-12-01 ENCOUNTER — Emergency Department (HOSPITAL_COMMUNITY)
Admission: EM | Admit: 2017-12-01 | Discharge: 2017-12-01 | Disposition: A | Discharge status: Home / Self Care | Payer: Self-pay | Attending: Emergency Medicine | Admitting: Emergency Medicine

## 2017-12-01 ENCOUNTER — Other Ambulatory Visit: Payer: Self-pay

## 2017-12-01 ENCOUNTER — Encounter (HOSPITAL_COMMUNITY): Payer: Self-pay | Admitting: Emergency Medicine

## 2017-12-01 ENCOUNTER — Emergency Department (HOSPITAL_COMMUNITY): Payer: Self-pay

## 2017-12-01 DIAGNOSIS — M545 Low back pain, unspecified: Secondary | ICD-10-CM

## 2017-12-01 DIAGNOSIS — R3916 Straining to void: Secondary | ICD-10-CM

## 2017-12-01 LAB — I-STAT CREATININE, ED: CREATININE: 0.7 mg/dL (ref 0.61–1.24)

## 2017-12-01 MED ORDER — NICOTINE 21 MG/24HR TD PT24: 21.0000 mg | MEDICATED_PATCH | Freq: Every day | TRANSDERMAL | 0 refills | Status: DC

## 2017-12-01 MED ORDER — DIAZEPAM 5 MG PO TABS
5.0000 mg | ORAL_TABLET | Freq: Once | ORAL | Status: AC
  Administered 2017-12-01: 5 mg via ORAL
  Filled 2017-12-01: qty 1

## 2017-12-01 MED ORDER — LIDOCAINE 5 % EX PTCH: 1.0000 | MEDICATED_PATCH | CUTANEOUS | 0 refills | Status: DC

## 2017-12-01 MED ORDER — GADOBENATE DIMEGLUMINE 529 MG/ML IV SOLN
20.0000 mL | Freq: Once | INTRAVENOUS | Status: AC | PRN
  Administered 2017-12-01: 20 mL via INTRAVENOUS

## 2017-12-01 NOTE — ED Triage Notes (Signed)
Patient complaining of lower back pain since injury on 7/26. States he was treated here for same and has appt with orthopedic doctor and called them to see if he could get back in sooner but was told to go to ER.

## 2017-12-01 NOTE — ED Provider Notes (Signed)
[facility] Emergency Department Provider Note MRN:  [id number]  Arrival date & time: 12/01/17     Chief Complaint   Back Pain   History of Present Illness   Darrell EdmanJuan Parker is a 62 y.o. year-old male with no pertinent past medical history presenting to the ED with chief complaint of back pain.  Patient explains that he initially hurt his lower back while lifting a heavy object at work.  This occurred 1 to 2 weeks ago.  His pain is located in his mid lower back, the pain is constant, severe, described as a "knot" in his back.  States that the pain improves when walking during the day.  Over the past 4 to 5 days, patient has began to experience urinary retention, stating that he has to try to urinate multiple times a day, having difficulty getting the urine out.  Also endorsing subjective numbness and weakness of the right leg.  Review of Systems  A complete 10 system review of systems was obtained and all systems are negative except as noted in the HPI and PMH.   Patient's Health History    Past Medical History:  Diagnosis Date  . Hearing deficit   . Pneumonia     Past Surgical History:  Procedure Laterality Date  . KNEE SURGERY      Family History  Problem Relation Age of Onset  . Kidney failure Mother   . Heart attack Mother     Social History   Socioeconomic History  . Marital status: Single    Spouse name: Not on file  . Number of children: Not on file  . Years of education: Not on file  . Highest education level: Not on file  Occupational History  . Not on file  Social Needs  . Financial resource strain: Not on file  . Food insecurity:    Worry: Not on file    Inability: Not on file  . Transportation needs:    Medical: Not on file    Non-medical: Not on file  Tobacco Use  . Smoking status: Current Every Day Smoker    Packs/day: 0.50    Types: Cigarettes  . Smokeless tobacco: Never Used  Substance and Sexual Activity  . Alcohol use: No  .  Drug use: No  . Sexual activity: Not on file  Lifestyle  . Physical activity:    Days per week: Not on file    Minutes per session: Not on file  . Stress: Not on file  Relationships  . Social connections:    Talks on phone: Not on file    Gets together: Not on file    Attends religious service: Not on file    Active member of club or organization: Not on file    Attends meetings of clubs or organizations: Not on file    Relationship status: Not on file  . Intimate partner violence:    Fear of current or ex partner: Not on file    Emotionally abused: Not on file    Physically abused: Not on file    Forced sexual activity: Not on file  Other Topics Concern  . Not on file  Social History Narrative  . Not on file     Physical Exam  Vital Signs and Nursing Notes reviewed Vitals:   12/01/17 1250  BP: (!) 167/96  Pulse: (!) 101  Resp: 20  Temp: 97.9 F (36.6 C)  SpO2: 99%    CONSTITUTIONAL: Well-appearing, NAD NEURO:  Alert and oriented x 3, no focal deficits EYES:  eyes equal and reactive ENT/NECK:  no LAD, no JVD CARDIO: Regular rate, well-perfused, normal S1 and S2 PULM:  CTAB no wheezing or rhonchi GI/GU:  normal bowel sounds, non-distended, non-tender MSK/SPINE:  No gross deformities, no edema; tenderness palpation to the midline lumbar spine SKIN:  no rash, atraumatic PSYCH:  Appropriate speech and behavior  Diagnostic and Interventional Summary    EKG Interpretation  Date/Time:    Ventricular Rate:    PR Interval:    QRS Duration:   QT Interval:    QTC Calculation:   R Axis:     Text Interpretation:        Labs Reviewed  I-STAT CREATININE, ED    MR Lumbar Spine W Wo Contrast  Final Result      Medications  diazepam (VALIUM) tablet 5 mg (5 mg Oral Given 12/01/17 1321)  gadobenate dimeglumine (MULTIHANCE) injection 20 mL (20 mLs Intravenous Contrast Given 12/01/17 1331)     Procedures Critical Care  ED Course and Medical Decision Making  I have  reviewed the triage vital signs and the nursing notes.  Pertinent labs & imaging results that were available during my care of the patient were reviewed by me and considered in my medical decision making (see below for details). Clinical Course as of Dec 01 1717  Thu Dec 01, 2017  6768 62 year old male with persistent back pain for 1 to 2 weeks after lifting a heavy object, more recently with urinary retention, subjective numbness weakness of the right leg.  Concern for disc herniation with possible cord involvement.  MRI pending.   [MB]  1403 MRI reveals irregularities at the L4-L5 space, edema, disc bulging, possibility of cord irritation.  Will discuss with neurosurgery.   [MB]    Clinical Course User Index [MB] Sabas Sous, MD    MRI results not concerning per neurosurgery, can be managed by patient's primary care provider.  Given prescription for lidocaine patches, work note to facilitate recovery.  After the discussed management above, the patient was determined to be safe for discharge.  The patient was in agreement with this plan and all questions regarding their care were answered.  ED return precautions were discussed and the patient will return to the ED with any significant worsening of condition.  Elmer Sow. Pilar Plate, MD [facility] Emergency Medicine [facility] [email or web address]  Final Clinical Impressions(s) / ED Diagnoses     ICD-10-CM   1. Acute midline low back pain without sciatica M54.5   2. Urinary straining R39.16     ED Discharge Orders        Ordered    lidocaine (LIDODERM) 5 %  Every 24 hours     12/01/17 1455    nicotine (NICODERM CQ - DOSED IN MG/24 HOURS) 21 mg/24hr patch  Daily     12/01/17 1503         Sabas Sous, MD 12/01/17 1719

## 2017-12-01 NOTE — Discharge Instructions (Addendum)
You were evaluated at the [facility] emergency Department.  After careful evaluation, we did not find any emergent condition requiring admission or further testing in the hospital.  Your symptoms today seem to be due to muscle strain or spasm.  Please rest your back as much as you can for the next 5 to 7 days.  Avoid heavy lifting.  Follow-up with your primary care physician.  Please return to the Emergency Department if you experience any worsening of your condition.  We encourage you to follow up with a primary care provider.  Thank you for allowing us to be a part of your care.

## 2017-12-01 NOTE — ED Notes (Signed)
Patient transported to X-ray 

## 2018-03-15 ENCOUNTER — Emergency Department (HOSPITAL_COMMUNITY): Payer: Self-pay

## 2018-03-15 ENCOUNTER — Observation Stay (HOSPITAL_COMMUNITY): Payer: Self-pay

## 2018-03-15 ENCOUNTER — Encounter (HOSPITAL_COMMUNITY): Payer: Self-pay | Admitting: Emergency Medicine

## 2018-03-15 ENCOUNTER — Other Ambulatory Visit: Payer: Self-pay

## 2018-03-15 ENCOUNTER — Observation Stay (HOSPITAL_COMMUNITY)
Admission: EM | Admit: 2018-03-15 | Discharge: 2018-03-16 | Disposition: A | Discharge status: Home / Self Care | Payer: Self-pay | Attending: Family Medicine | Admitting: Family Medicine

## 2018-03-15 ENCOUNTER — Observation Stay (HOSPITAL_BASED_OUTPATIENT_CLINIC_OR_DEPARTMENT_OTHER): Payer: Self-pay

## 2018-03-15 DIAGNOSIS — G4700 Insomnia, unspecified: Secondary | ICD-10-CM | POA: Diagnosis present

## 2018-03-15 DIAGNOSIS — G459 Transient cerebral ischemic attack, unspecified: Principal | ICD-10-CM | POA: Diagnosis present

## 2018-03-15 DIAGNOSIS — I10 Essential (primary) hypertension: Secondary | ICD-10-CM | POA: Diagnosis present

## 2018-03-15 DIAGNOSIS — Z720 Tobacco use: Secondary | ICD-10-CM | POA: Diagnosis present

## 2018-03-15 DIAGNOSIS — R42 Dizziness and giddiness: Secondary | ICD-10-CM

## 2018-03-15 DIAGNOSIS — Z79899 Other long term (current) drug therapy: Secondary | ICD-10-CM | POA: Insufficient documentation

## 2018-03-15 DIAGNOSIS — R3129 Other microscopic hematuria: Secondary | ICD-10-CM | POA: Diagnosis present

## 2018-03-15 DIAGNOSIS — R200 Anesthesia of skin: Secondary | ICD-10-CM | POA: Diagnosis present

## 2018-03-15 DIAGNOSIS — F17210 Nicotine dependence, cigarettes, uncomplicated: Secondary | ICD-10-CM | POA: Insufficient documentation

## 2018-03-15 DIAGNOSIS — I351 Nonrheumatic aortic (valve) insufficiency: Secondary | ICD-10-CM

## 2018-03-15 DIAGNOSIS — R55 Syncope and collapse: Secondary | ICD-10-CM

## 2018-03-15 DIAGNOSIS — R202 Paresthesia of skin: Secondary | ICD-10-CM

## 2018-03-15 LAB — ETHANOL: Alcohol, Ethyl (B): <10 mg/dL (ref <10)

## 2018-03-15 LAB — CBC
HCT: 43.5 % (ref 39.0–52.0)
Hemoglobin: 14.3 g/dL (ref 13.0–17.0)
MCH: 29.8 pg (ref 26.0–34.0)
MCHC: 32.9 g/dL (ref 30.0–36.0)
MCV: 90.6 fL (ref 80.0–100.0)
Platelets: 298 10*3/uL (ref 150–400)
RBC: 4.8 MIL/uL (ref 4.22–5.81)
RDW: 13.2 % (ref 11.5–15.5)
WBC: 7.1 10*3/uL (ref 4.0–10.5)
nRBC: 0 % (ref 0.0–0.2)

## 2018-03-15 LAB — HEPATIC FUNCTION PANEL
ALBUMIN: 3.9 g/dL (ref 3.5–5.0)
ALT: 22 U/L (ref 0–44)
AST: 19 U/L (ref 15–41)
Alkaline Phosphatase: 85 U/L (ref 38–126)
BILIRUBIN DIRECT: 0.1 mg/dL (ref 0.0–0.2)
Indirect Bilirubin: 0.6 mg/dL (ref 0.3–0.9)
Total Bilirubin: 0.7 mg/dL (ref 0.3–1.2)
Total Protein: 6.8 g/dL (ref 6.5–8.1)

## 2018-03-15 LAB — URINALYSIS, ROUTINE W REFLEX MICROSCOPIC
Bilirubin Urine: NEGATIVE
GLUCOSE, UA: NEGATIVE mg/dL
Ketones, ur: NEGATIVE mg/dL
Leukocytes, UA: NEGATIVE
Nitrite: NEGATIVE
PROTEIN: NEGATIVE mg/dL
Specific Gravity, Urine: 1.012 (ref 1.005–1.030)
pH: 6 (ref 5.0–8.0)

## 2018-03-15 LAB — PROTIME-INR
INR: 0.99
PROTHROMBIN TIME: 13 s (ref 11.4–15.2)

## 2018-03-15 LAB — RAPID URINE DRUG SCREEN, HOSP PERFORMED
Amphetamines: NOT DETECTED
BARBITURATES: NOT DETECTED
Benzodiazepines: NOT DETECTED
Cocaine: NOT DETECTED
Opiates: NOT DETECTED
TETRAHYDROCANNABINOL: NOT DETECTED

## 2018-03-15 LAB — APTT: aPTT: 30 seconds (ref 24–36)

## 2018-03-15 LAB — DIFFERENTIAL
Basophils Absolute: 0.1 10*3/uL (ref 0.0–0.1)
Basophils Relative: 2 %
EOS ABS: 0.4 10*3/uL (ref 0.0–0.5)
EOS PCT: 5 %
LYMPHS ABS: 1.5 10*3/uL (ref 0.7–4.0)
Lymphocytes Relative: 20 %
MONO ABS: 0.7 10*3/uL (ref 0.1–1.0)
Monocytes Relative: 9 %
NEUTROS PCT: 63 %
Neutro Abs: 4.6 10*3/uL (ref 1.7–7.7)

## 2018-03-15 LAB — ECHOCARDIOGRAM COMPLETE
Height: 70 in
Weight: 3488 oz

## 2018-03-15 LAB — BASIC METABOLIC PANEL
Anion gap: 7 (ref 5–15)
BUN: 9 mg/dL (ref 8–23)
CO2: 24 mmol/L (ref 22–32)
CREATININE: 0.72 mg/dL (ref 0.61–1.24)
Calcium: 8.5 mg/dL — ABNORMAL LOW (ref 8.9–10.3)
Chloride: 107 mmol/L (ref 98–111)
GFR calc Af Amer: >60 mL/min (ref >60)
Glucose, Bld: 97 mg/dL (ref 70–99)
Potassium: 4.1 mmol/L (ref 3.5–5.1)
Sodium: 138 mmol/L (ref 135–145)

## 2018-03-15 LAB — TROPONIN I: Troponin I: <0.03 ng/mL (ref <0.03)

## 2018-03-15 MED ORDER — ASPIRIN 325 MG PO TABS
ORAL_TABLET | ORAL | Status: AC
  Filled 2018-03-15: qty 1

## 2018-03-15 MED ORDER — STROKE: EARLY STAGES OF RECOVERY BOOK
Freq: Once | Status: AC
  Administered 2018-03-15: 16:00:00

## 2018-03-15 MED ORDER — DIPHENHYDRAMINE HCL 50 MG/ML IJ SOLN
25.0000 mg | Freq: Once | INTRAMUSCULAR | Status: AC
  Administered 2018-03-15: 25 mg via INTRAVENOUS
  Filled 2018-03-15: qty 1

## 2018-03-15 MED ORDER — ENOXAPARIN SODIUM 40 MG/0.4ML ~~LOC~~ SOLN
40.0000 mg | SUBCUTANEOUS | Status: DC
  Administered 2018-03-15: 40 mg via SUBCUTANEOUS
  Filled 2018-03-15 (×2): qty 0.4

## 2018-03-15 MED ORDER — ASPIRIN 325 MG PO TABS
325.0000 mg | ORAL_TABLET | Freq: Once | ORAL | Status: AC
  Administered 2018-03-15: 325 mg via ORAL

## 2018-03-15 MED ORDER — OXYCODONE HCL 5 MG PO TABS
5.0000 mg | ORAL_TABLET | Freq: Once | ORAL | Status: AC
  Administered 2018-03-15: 5 mg via ORAL
  Filled 2018-03-15: qty 1

## 2018-03-15 MED ORDER — TRAMADOL HCL 50 MG PO TABS
50.0000 mg | ORAL_TABLET | Freq: Four times a day (QID) | ORAL | Status: DC | PRN
  Administered 2018-03-15: 50 mg via ORAL
  Filled 2018-03-15: qty 1

## 2018-03-15 MED ORDER — NICOTINE 21 MG/24HR TD PT24
21.0000 mg | MEDICATED_PATCH | Freq: Every day | TRANSDERMAL | Status: DC
  Administered 2018-03-15 – 2018-03-16 (×2): 21 mg via TRANSDERMAL
  Filled 2018-03-15 (×2): qty 1

## 2018-03-15 NOTE — Progress Notes (Signed)
SLP Cancellation Note  Patient Details Name: Malena EdmanJuan Gorniak MRN: [id number] DOB: 06-29-1955   Cancelled treatment:       Reason Eval/Treat Not Completed: SLP screened, no needs identified, will sign off. No cognitive-linguistic changes noted. Thank you for this referral,  Rayn Enderson H. Romie LeveeYarbrough MA, CCC-SLP Speech Language Pathologist    Georgetta Habermelia H Violette Morneault 03/15/2018, 2:03 PM

## 2018-03-15 NOTE — ED Provider Notes (Signed)
[facility] EMERGENCY DEPARTMENT Provider Note   CSN: [id number] Arrival date & time: 03/15/18  7829     History   Chief Complaint Chief Complaint  Patient presents with  . Dizziness    HPI Darrell Parker is a 62 y.o. male.  HPI  Pt was seen at 0805. Per pt, c/o sudden onset and resolution of one episode of "dizziness" that occurred this morning approximately 0545 PTA. Pt states he was "sitting at my machine at work" when he felt he had "little spots before my eyes," "everything started to move" and he "felt like I was going to pass out." This was associated with entire RUE "numbness." Pt states he felt "off balance" when he tried to walk.  Pt states his symptoms lasted approximately 45 minutes before resolving. States for the past 2 days he has felt "weak" and "not felt right." Denies palpitations/CP, no SOB/cough, no abd pain, no N/V/D, no back pain, no neck pain, no facial droop, no slurred speech, no visual changes, no eye pain, no flashers, no visual field cuts, no blurry vision.   Past Medical History:  Diagnosis Date  . Hearing deficit   . Pneumonia     There are no active problems to display for this patient.   Past Surgical History:  Procedure Laterality Date  . KNEE SURGERY          Home Medications    Prior to Admission medications   Medication Sig Start Date End Date Taking? Authorizing Provider  cyclobenzaprine (FLEXERIL) 5 MG tablet Take 1 or 2 po Q 6hrs for pain 11/25/17   Devoria Albe, MD  lidocaine (LIDODERM) 5 % Place 1 patch onto the skin daily. Remove & Discard patch within 12 hours or as directed by MD 12/01/17   Sabas Sous, MD  methocarbamol (ROBAXIN) 500 MG tablet Take 1 tablet (500 mg total) by mouth 2 (two) times daily. 11/01/17   Elson Areas, PA-C  nicotine (NICODERM CQ - DOSED IN MG/24 HOURS) 21 mg/24hr patch Place 1 patch (21 mg total) onto the skin daily. 12/01/17   Sabas Sous, MD  oxyCODONE-acetaminophen (PERCOCET/ROXICET) 5-325 MG tablet  Take 2 tablets by mouth every 4 (four) hours as needed for severe pain. 12/17/15   Idol, Raynelle Fanning, PA-C  predniSONE (DELTASONE) 20 MG tablet Take 3 po QD x 3d , then 2 po QD x 3d then 1 po QD x 3d 11/25/17   Devoria Albe, MD  traMADol (ULTRAM) 50 MG tablet Take 1 tablet (50 mg total) by mouth every 6 (six) hours as needed. Patient not taking: Reported on 12/17/2015 02/18/15   Linwood Dibbles, MD    Family History Family History  Problem Relation Age of Onset  . Kidney failure Mother   . Heart attack Mother     Social History Social History   Tobacco Use  . Smoking status: Current Every Day Smoker    Packs/day: 0.50    Types: Cigarettes  . Smokeless tobacco: Never Used  Substance Use Topics  . Alcohol use: No  . Drug use: No     Allergies   Motrin [ibuprofen] and Tylenol [acetaminophen]   Review of Systems Review of Systems ROS: Statement: All systems negative except as marked or noted in the HPI; Constitutional: Negative for fever and chills. ; ; Eyes: Negative for eye pain, redness and discharge. ; ; ENMT: Negative for ear pain, hoarseness, nasal congestion, sinus pressure and sore throat. ; ; Cardiovascular: Negative for chest pain, palpitations,  diaphoresis, dyspnea and peripheral edema. ; ; Respiratory: Negative for cough, wheezing and stridor. ; ; Gastrointestinal: Negative for nausea, vomiting, diarrhea, abdominal pain, blood in stool, hematemesis, jaundice and rectal bleeding. . ; ; Genitourinary: Negative for dysuria, flank pain and hematuria. ; ; Musculoskeletal: Negative for back pain and neck pain. Negative for swelling and trauma.; ; Skin: Negative for pruritus, rash, abrasions, blisters, bruising and skin lesion.; ; Neuro: +paresthesias, spinning, off balance, generalized weakness. Negative for headache, lightheadedness and neck stiffness. Negative for altered level of consciousness, altered mental status, extremity weakness, involuntary movement, seizure and syncope.        Physical Exam Updated Vital Signs BP (!) 148/98 (BP Location: Right Arm)   Pulse 79   Temp 98.3 F (36.8 C) (Oral)   Resp 18   Ht 5\' 10"  (1.778 m)   Wt 98.9 kg   SpO2 97%   BMI 31.28 kg/m   Physical Exam 0810: Physical examination:  Nursing notes reviewed; Vital signs and O2 SAT reviewed;  Constitutional: Well developed, Well nourished, Well hydrated, In no acute distress; Head:  Normocephalic, atraumatic; Eyes: EOMI, PERRL, No scleral icterus; ENMT: Mouth and pharynx normal, Mucous membranes moist; Neck: Supple, Full range of motion, No lymphadenopathy; Cardiovascular: Regular rate and rhythm, No gallop; Respiratory: Breath sounds clear & equal bilaterally, No wheezes.  Speaking full sentences with ease, Normal respiratory effort/excursion; Chest: Nontender, Movement normal; Abdomen: Soft, Nontender, Nondistended, Normal bowel sounds; Genitourinary: No CVA tenderness; Extremities: Peripheral pulses normal, No tenderness, No edema, No calf edema or asymmetry.; Neuro: AA&Ox3, Major CN grossly intact. Speech clear.  No facial droop.  No nystagmus. Grips equal. Strength 5/5 equal bilat UE's and LE's.  DTR 2/4 equal bilat UE's and LE's.  No gross sensory deficits.  Normal cerebellar testing bilat UE's (finger-nose) and LE's (heel-shin)..; Skin: Color normal, Warm, Dry.   ED Treatments / Results  Labs (all labs ordered are listed, but only abnormal results are displayed)   EKG EKG Interpretation  Date/Time:  Wednesday March 15 2018 07:38:11 EST Ventricular Rate:  76 PR Interval:    QRS Duration: 99 QT Interval:  404 QTC Calculation: 455 R Axis:   79 Text Interpretation:  Sinus rhythm Baseline wander When compared with ECG of 02/18/2015 No significant change was found Confirmed by Samuel Jester [phone]) on 03/15/2018 8:16:46 AM   Radiology   Procedures Procedures (including critical care time)  Medications Ordered in ED Medications - No data to  display   Initial Impression / Assessment and Plan / ED Course  I have reviewed the triage vital signs and the nursing notes.  Pertinent labs & imaging results that were available during my care of the patient were reviewed by me and considered in my medical decision making (see chart for details).  MDM Reviewed: previous chart, nursing note and vitals Reviewed previous: labs and ECG Interpretation: labs, ECG, x-ray and CT scan   Results for orders placed or performed during the hospital encounter of 03/15/18  Basic metabolic panel  Result Value Ref Range   Sodium 138 135 - 145 mmol/L   Potassium 4.1 3.5 - 5.1 mmol/L   Chloride 107 98 - 111 mmol/L   CO2 24 22 - 32 mmol/L   Glucose, Bld 97 70 - 99 mg/dL   BUN 9 8 - 23 mg/dL   Creatinine, Ser 6.04 0.61 - 1.24 mg/dL   Calcium 8.5 (L) 8.9 - 10.3 mg/dL   GFR calc non Af Amer >60 >60 mL/min  GFR calc Af Amer >60 >60 mL/min   Anion gap 7 5 - 15  CBC  Result Value Ref Range   WBC 7.1 4.0 - 10.5 K/uL   RBC 4.80 4.22 - 5.81 MIL/uL   Hemoglobin 14.3 13.0 - 17.0 g/dL   HCT 16.143.5 09.639.0 - 04.552.0 %   MCV 90.6 80.0 - 100.0 fL   MCH 29.8 26.0 - 34.0 pg   MCHC 32.9 30.0 - 36.0 g/dL   RDW 40.913.2 81.111.5 - 91.415.5 %   Platelets 298 150 - 400 K/uL   nRBC 0.0 0.0 - 0.2 %  Urinalysis, Routine w reflex microscopic  Result Value Ref Range   Color, Urine STRAW (A) YELLOW   APPearance CLEAR CLEAR   Specific Gravity, Urine 1.012 1.005 - 1.030   pH 6.0 5.0 - 8.0   Glucose, UA NEGATIVE NEGATIVE mg/dL   Hgb urine dipstick MODERATE (A) NEGATIVE   Bilirubin Urine NEGATIVE NEGATIVE   Ketones, ur NEGATIVE NEGATIVE mg/dL   Protein, ur NEGATIVE NEGATIVE mg/dL   Nitrite NEGATIVE NEGATIVE   Leukocytes, UA NEGATIVE NEGATIVE   RBC / HPF 21-50 0 - 5 RBC/hpf   WBC, UA 0-5 0 - 5 WBC/hpf   Bacteria, UA RARE (A) NONE SEEN   Squamous Epithelial / LPF 0-5 0 - 5   Hyaline Casts, UA PRESENT    Ca Oxalate Crys, UA PRESENT   Hepatic function panel  Result Value Ref  Range   Total Protein 6.8 6.5 - 8.1 g/dL   Albumin 3.9 3.5 - 5.0 g/dL   AST 19 15 - 41 U/L   ALT 22 0 - 44 U/L   Alkaline Phosphatase 85 38 - 126 U/L   Total Bilirubin 0.7 0.3 - 1.2 mg/dL   Bilirubin, Direct 0.1 0.0 - 0.2 mg/dL   Indirect Bilirubin 0.6 0.3 - 0.9 mg/dL  Ethanol  Result Value Ref Range   Alcohol, Ethyl (B) <10 <10 mg/dL  Protime-INR  Result Value Ref Range   Prothrombin Time 13.0 11.4 - 15.2 seconds   INR 0.99   APTT  Result Value Ref Range   aPTT 30 24 - 36 seconds  Urine rapid drug screen (hosp performed)not [facility]  Result Value Ref Range   Opiates NONE DETECTED NONE DETECTED   Cocaine NONE DETECTED NONE DETECTED   Benzodiazepines NONE DETECTED NONE DETECTED   Amphetamines NONE DETECTED NONE DETECTED   Tetrahydrocannabinol NONE DETECTED NONE DETECTED   Barbiturates NONE DETECTED NONE DETECTED  Troponin I - ONCE - STAT  Result Value Ref Range   Troponin I <0.03 <0.03 ng/mL  Differential  Result Value Ref Range   Neutrophils Relative % 63 %   Neutro Abs 4.6 1.7 - 7.7 K/uL   Lymphocytes Relative 20 %   Lymphs Abs 1.5 0.7 - 4.0 K/uL   Monocytes Relative 9 %   Monocytes Absolute 0.7 0.1 - 1.0 K/uL   Eosinophils Relative 5 %   Eosinophils Absolute 0.4 0.0 - 0.5 K/uL   Basophils Relative 2 %   Basophils Absolute 0.1 0.0 - 0.1 K/uL   Dg Chest 2 View Result Date: 03/15/2018 CLINICAL DATA:  Dizziness, weakness EXAM: CHEST - 2 VIEW COMPARISON:  02/18/2015 FINDINGS: Heart and mediastinal contours are within normal limits. No focal opacities or effusions. No acute bony abnormality. IMPRESSION: No active cardiopulmonary disease. Electronically Signed   By: Charlett NoseKevin  Dover M.D.   On: 03/15/2018 08:58   Ct Head Wo Contrast Result Date: 03/15/2018 CLINICAL DATA:  Dizziness and  weakness. EXAM: CT HEAD WITHOUT CONTRAST TECHNIQUE: Contiguous axial images were obtained from the base of the skull through the vertex without intravenous contrast. COMPARISON:  02/14/2015  FINDINGS: Brain: No evidence of acute infarction, hemorrhage, hydrocephalus, extra-axial collection or mass lesion/mass effect. Vascular: No hyperdense vessel or unexpected calcification. Skull: Normal. Negative for fracture or focal lesion. Sinuses/Orbits: No acute finding. Other: None. IMPRESSION: Normal head CT. Electronically Signed   By: Irish Lack M.D.   On: 03/15/2018 09:22    0935:  Symptoms remain resolved while in the ED. Not code stroke d/t resolved symptoms, though concerned regarding possible TIA. T/C returned from [facility] Dr. Gaynelle Adu, case discussed, including:  HPI, pertinent PM/SHx, VS/PE, dx testing, ED course and treatment:  Agrees symptoms possible TIA vs vertigo vs complicated migraine; will need admit for TIA workup.    0940:  T/C returned from [facility] Dr. Jarvis Newcomer, case discussed, including:  HPI, pertinent PM/SHx, VS/PE, dx testing, ED course and treatment:  Agreeable to admit.     Final Clinical Impressions(s) / ED Diagnoses   Final diagnoses:  None    ED Discharge Orders    None       Samuel Jester, DO 03/17/18 1610

## 2018-03-15 NOTE — ED Notes (Signed)
Telene[facility] called for Dr. Clarene DukeMcManus at [phone].

## 2018-03-15 NOTE — ED Triage Notes (Signed)
Pt states this morning he had a moment of dizziness and saw black spots.  States he has been feeling weak since Monday.  Pt a&o x 4,  Ambulated to room from waiting room without difficulty.  Denies any pain, n/v/d.

## 2018-03-15 NOTE — Progress Notes (Signed)
*  PRELIMINARY RESULTS* Echocardiogram 2D Echocardiogram has been performed.  Stacey DrainWhite, Jessikah Dicker J 03/15/2018, 2:00 PM

## 2018-03-15 NOTE — ED Notes (Signed)
ED TO INPATIENT HANDOFF REPORT  Name/Age/Gender Darrell Parker 62 y.o. male  Code Status   Home/SNF/Other Home  Chief Complaint Dizziness  Level of Care/Admitting Diagnosis ED Disposition    ED Disposition Condition [facility] Area: [facility] [[id number]]  Level of Care: Telemetry [5]  Diagnosis: TIA (transient ischemic attack) [038882]  Admitting Physician: Patrecia Pour [phone]  Attending Physician: Patrecia Pour [phone]  PT Class (Do Not Modify): Observation [104]  PT Acc Code (Do Not Modify): Observation [10022]       Medical History Past Medical History:  Diagnosis Date  . Hearing deficit   . Pneumonia     Allergies Allergies  Allergen Reactions  . Motrin [Ibuprofen] Hives and Swelling    Per patient can take asa  . Tylenol [Acetaminophen] Itching    IV Location/Drains/Wounds Patient Lines/Drains/Airways Status   Active Line/Drains/Airways    Name:   Placement date:   Placement time:   Site:   Days:   Peripheral IV 03/15/18 Left Hand   03/15/18    1108    Hand   less than 1          Labs/Imaging Results for orders placed or performed during the hospital encounter of 03/15/18 (from the past 48 hour(s))  Urinalysis, Routine w reflex microscopic     Status: Abnormal   Collection Time: 03/15/18  7:31 AM  Result Value Ref Range   Color, Urine STRAW (A) YELLOW   APPearance CLEAR CLEAR   Specific Gravity, Urine 1.012 1.005 - 1.030   pH 6.0 5.0 - 8.0   Glucose, UA NEGATIVE NEGATIVE mg/dL   Hgb urine dipstick MODERATE (A) NEGATIVE   Bilirubin Urine NEGATIVE NEGATIVE   Ketones, ur NEGATIVE NEGATIVE mg/dL   Protein, ur NEGATIVE NEGATIVE mg/dL   Nitrite NEGATIVE NEGATIVE   Leukocytes, UA NEGATIVE NEGATIVE   RBC / HPF 21-50 0 - 5 RBC/hpf   WBC, UA 0-5 0 - 5 WBC/hpf   Bacteria, UA RARE (A) NONE SEEN   Squamous Epithelial / LPF 0-5 0 - 5   Hyaline Casts, UA PRESENT    Ca Oxalate Crys, UA PRESENT     Comment: Performed at [facility], [address], [place], [place] [address]  Basic metabolic panel     Status: Abnormal   Collection Time: 03/15/18  7:50 AM  Result Value Ref Range   Sodium 138 135 - 145 mmol/L   Potassium 4.1 3.5 - 5.1 mmol/L   Chloride 107 98 - 111 mmol/L   CO2 24 22 - 32 mmol/L   Glucose, Bld 97 70 - 99 mg/dL   BUN 9 8 - 23 mg/dL   Creatinine, Ser 0.72 0.61 - 1.24 mg/dL   Calcium 8.5 (L) 8.9 - 10.3 mg/dL   GFR calc non Af Amer >60 >60 mL/min   GFR calc Af Amer >60 >60 mL/min    Comment: (NOTE) The eGFR has been calculated using the CKD EPI equation. This calculation has not been validated in all clinical situations. eGFR's persistently <60 mL/min signify possible Chronic Kidney Disease.    Anion gap 7 5 - 15    Comment: Performed at [facility], [address] [place], [place] [address]  CBC     Status: None   Collection Time: 03/15/18  7:50 AM  Result Value Ref Range   WBC 7.1 4.0 - 10.5 K/uL   RBC 4.80 4.22 - 5.81 MIL/uL   Hemoglobin 14.3 13.0 - 17.0  g/dL   HCT 43.5 39.0 - 52.0 %   MCV 90.6 80.0 - 100.0 fL   MCH 29.8 26.0 - 34.0 pg   MCHC 32.9 30.0 - 36.0 g/dL   RDW 13.2 11.5 - 15.5 %   Platelets 298 150 - 400 K/uL   nRBC 0.0 0.0 - 0.2 %    Comment: Performed at [facility], [address], [place], [place] [address]  Hepatic function panel     Status: None   Collection Time: 03/15/18  7:50 AM  Result Value Ref Range   Total Protein 6.8 6.5 - 8.1 g/dL   Albumin 3.9 3.5 - 5.0 g/dL   AST 19 15 - 41 U/L   ALT 22 0 - 44 U/L   Alkaline Phosphatase 85 38 - 126 U/L   Total Bilirubin 0.7 0.3 - 1.2 mg/dL   Bilirubin, Direct 0.1 0.0 - 0.2 mg/dL   Indirect Bilirubin 0.6 0.3 - 0.9 mg/dL    Comment: Performed at [facility], [address] [place], [place] [address]  Ethanol     Status: None   Collection Time: 03/15/18  7:50 AM  Result Value Ref Range   Alcohol, Ethyl (B) <10 <10 mg/dL    Comment: (NOTE) Lowest detectable limit for serum alcohol is 10 mg/dL. For medical purposes  only. Performed at [facility], [address] [place], [place] [address]   Protime-INR     Status: None   Collection Time: 03/15/18  7:50 AM  Result Value Ref Range   Prothrombin Time 13.0 11.4 - 15.2 seconds   INR 0.99     Comment: Performed at [facility], [address] [place], [place] [address]  APTT     Status: None   Collection Time: 03/15/18  7:50 AM  Result Value Ref Range   aPTT 30 24 - 36 seconds    Comment: Performed at [facility], [address], [place], [place] [address]  Troponin I - ONCE - STAT     Status: None   Collection Time: 03/15/18  7:50 AM  Result Value Ref Range   Troponin I <0.03 <0.03 ng/mL    Comment: Performed at [facility], [address], [place], [place] [address]  Differential     Status: None   Collection Time: 03/15/18  7:50 AM  Result Value Ref Range   Neutrophils Relative % 63 %   Neutro Abs 4.6 1.7 - 7.7 K/uL   Lymphocytes Relative 20 %   Lymphs Abs 1.5 0.7 - 4.0 K/uL   Monocytes Relative 9 %   Monocytes Absolute 0.7 0.1 - 1.0 K/uL   Eosinophils Relative 5 %   Eosinophils Absolute 0.4 0.0 - 0.5 K/uL   Basophils Relative 2 %   Basophils Absolute 0.1 0.0 - 0.1 K/uL    Comment: Performed at [facility], [address] [place], [place] [address]  Urine rapid drug screen (hosp performed)not at [facility]     Status: None   Collection Time: 03/15/18  8:14 AM  Result Value Ref Range   Opiates NONE DETECTED NONE DETECTED   Cocaine NONE DETECTED NONE DETECTED   Benzodiazepines NONE DETECTED NONE DETECTED   Amphetamines NONE DETECTED NONE DETECTED   Tetrahydrocannabinol NONE DETECTED NONE DETECTED   Barbiturates NONE DETECTED NONE DETECTED    Comment: (NOTE) DRUG SCREEN FOR MEDICAL PURPOSES ONLY.  IF CONFIRMATION IS NEEDED FOR ANY PURPOSE, NOTIFY LAB WITHIN 5 DAYS. LOWEST DETECTABLE LIMITS FOR URINE DRUG SCREEN Drug Class  Cutoff (ng/mL) Amphetamine and metabolites    1000 Barbiturate and metabolites     200 Benzodiazepine                 481 Tricyclics and metabolites     300 Opiates and metabolites        300 Cocaine and metabolites        300 THC                            50 Performed at [facility] [address], [place], [place] [address]    Dg Chest 2 View  Result Date: 03/15/2018 CLINICAL DATA:  Dizziness, weakness EXAM: CHEST - 2 VIEW COMPARISON:  02/18/2015 FINDINGS: Heart and mediastinal contours are within normal limits. No focal opacities or effusions. No acute bony abnormality. IMPRESSION: No active cardiopulmonary disease. Electronically Signed   By: Rolm Baptise M.D.   On: 03/15/2018 08:58   Ct Head Wo Contrast  Result Date: 03/15/2018 CLINICAL DATA:  Dizziness and weakness. EXAM: CT HEAD WITHOUT CONTRAST TECHNIQUE: Contiguous axial images were obtained from the base of the skull through the vertex without intravenous contrast. COMPARISON:  02/14/2015 FINDINGS: Brain: No evidence of acute infarction, hemorrhage, hydrocephalus, extra-axial collection or mass lesion/mass effect. Vascular: No hyperdense vessel or unexpected calcification. Skull: Normal. Negative for fracture or focal lesion. Sinuses/Orbits: No acute finding. Other: None. IMPRESSION: Normal head CT. Electronically Signed   By: Aletta Edouard M.D.   On: 03/15/2018 09:22    Pending Labs [employer] (From admission, onward)    Start     Ordered   Signed and Held  HIV antibody (Routine Testing)  Tomorrow morning,   R     Signed and Held   Signed and Held  Hemoglobin A1c  Tomorrow morning,   R     Signed and Held   Signed and Held  Lipid panel  Tomorrow morning,   R    Comments:  Fasting    Signed and Held   Signed and Held  Creatinine, serum  (enoxaparin (LOVENOX)    CrCl >/= 30 ml/min)  Weekly,   R    Comments:  while on enoxaparin therapy    Signed and Held          Vitals/Pain Today's Vitals   03/15/18 1008 03/15/18 1030 03/15/18 1100 03/15/18 1130  BP: (!) 160/90 139/72 128/78 138/75   Pulse: 60 65 61 (!) 59  Resp: 16 19 11 18   Temp:      TempSrc:      SpO2: 94% 96% 97% 95%  Weight:      Height:      PainSc:        Isolation Precautions No active isolations  Medications Medications  aspirin tablet 325 mg (325 mg Oral Given 03/15/18 1108)    Mobility walks

## 2018-03-15 NOTE — Progress Notes (Signed)
PT Cancellation Note  Patient Details Name: Darrell EdmanJuan Wilton MRN: [id number] DOB: 1955/11/18   Cancelled Treatment:    Reason Eval/Treat Not Completed: Patient at procedure or test/unavailable(pt currently off the floor for imaging; will check back at a later time/day to evaluate patient)   Jac CanavanBrooke Powell PT, DPT

## 2018-03-15 NOTE — H&P (Signed)
History and Physical   Darrell Parker AOZ:[id number] DOB: 11/27/1955 DOA: 03/15/2018  Referring MD/NP/PA: Clarene DukeMcManus, EDP PCP: None Outpatient Specialists: None  Patient coming from: Home by way of work site  Chief Complaint: Right arm numbness, dizziness  HPI: Darrell Parker is a 62 y.o. male with no medical follow up and tobacco use who presented to the ED for abrupt onset of right arm numbness, dizziness and visual changes. 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 After taking some deep breaths this seemed to improve but not resolve, also noting black spots bilaterally in vision described as "gnats flying." He also noticed numbness along the whole right arm including the shoulder that has continued constantly, waxing and waning course with moderate intensity. This has not been improved with any changes in positioning. He was still having these symptoms associated with the sensation that he was going to pass out, so his boss told him to come to the ED. The dizziness and generalized weakness resolved after about 30-40 minutes but the arm remains numb.    ED Course: Mildly hypertensive, otherwise VSS. Labs (CBC, CMP, troponin, EtOH level) unremarkable. Urinalysis without pyuria, but noticed hemoglobinuria.   Review of Systems: No fever, chills, weight loss, headache, hearing changes, difficulty speaking or understanding words, falls, abd pain, dyspnea, N/V/D, myalgias, arthralgias, and per HPI. All others reviewed and are negative.   PMSH: Denies significant PMH or medical follow up. Has had knee surgery documented.   Meds: Takes none regularly  Allergies: Reports NKDA, motrin and tylenol listed in EMR  SH: Started smoking ~ 2 packs per week 4 years ago after wife died. Has had the same job in [employer] for 37 years, lives alone. No EtOH or other  drugs.  FH: Mother had heart disease at an older age, no history of strokes or other conditions per patient.  Physical Exam: BP (!) 160/90 (BP Location: Left Arm)   Pulse 60   Temp 98.3 F (36.8 C) (Oral)   Resp 16   Ht 5\' 10"  (1.778 m)   Wt 98.9 kg   SpO2 94%   BMI 31.28 kg/m    Constitutional: 62 y.o. male in no distress, calm demeanor Eyes: Lids and conjunctivae normal, PERRL ENMT: Mucous membranes are moist. Posterior pharynx clear of any exudate or lesions. Fair dentition.  Neck: normal, supple, no masses, no thyromegaly Respiratory: Non-labored breathing room air without accessory muscle use. Clear breath sounds to auscultation bilaterally Cardiovascular: Regular rate and rhythm, no murmurs, rubs, or gallops. No carotid bruits. No JVD. No LE edema. 2+ pedal pulses. Abdomen: Normoactive bowel sounds. No tenderness, non-distended, and no masses palpated. No hepatosplenomegaly. GU: No indwelling catheter Musculoskeletal: No clubbing / cyanosis. No joint deformity upper and lower extremities. Good ROM, no contractures. Normal muscle tone.  Skin: Warm, dry. No rashes, wounds, or ulcers. No significant lesions noted.  Neurologic: CN II-XII grossly intact, though has some equivocal right upper facial numbness. Gait not assessed, previously reported as normal. Speech normal. No focal deficits in motor strength throughout. Numbness reported along anterior upper RUE, lateral forearm extending to the thumb partially involving the index finger, but no involvement on fingers 3-5. DTRs 2+.  Psychiatric: Alert and oriented x3. Normal judgment and insight. Mood euthymic with congruent affect.   Labs on Admission: I have personally reviewed following labs and imaging studies  CBC: Recent Labs  Lab 03/15/18 0750  WBC 7.1  NEUTROABS 4.6  HGB 14.3  HCT 43.5  MCV 90.6  PLT 298   Basic Metabolic Panel: Recent Labs  Lab 03/15/18 0750  NA 138  K 4.1  CL 107  CO2 24  GLUCOSE 97  BUN 9   CREATININE 0.72  CALCIUM 8.5*   GFR: Estimated Creatinine Clearance: 112.9 mL/min (by C-G formula based on SCr of 0.72 mg/dL). Liver Function Tests: Recent Labs  Lab 03/15/18 0750  AST 19  ALT 22  ALKPHOS 85  BILITOT 0.7  PROT 6.8  ALBUMIN 3.9   No results for input(s): LIPASE, AMYLASE in the last 168 hours. No results for input(s): AMMONIA in the last 168 hours. Coagulation Profile: Recent Labs  Lab 03/15/18 0750  INR 0.99   Cardiac Enzymes: Recent Labs  Lab 03/15/18 0750  TROPONINI <0.03   BNP (last 3 results) No results for input(s): PROBNP in the last 8760 hours. HbA1C: No results for input(s): HGBA1C in the last 72 hours. CBG: No results for input(s): GLUCAP in the last 168 hours. Lipid Profile: No results for input(s): CHOL, HDL, LDLCALC, TRIG, CHOLHDL, LDLDIRECT in the last 72 hours. Thyroid Function Tests: No results for input(s): TSH, T4TOTAL, FREET4, T3FREE, THYROIDAB in the last 72 hours. Anemia Panel: No results for input(s): VITAMINB12, FOLATE, FERRITIN, TIBC, IRON, RETICCTPCT in the last 72 hours. Urine analysis:    Component Value Date/Time   COLORURINE STRAW (A) 03/15/2018 0731   APPEARANCEUR CLEAR 03/15/2018 0731   LABSPEC 1.012 03/15/2018 0731   PHURINE 6.0 03/15/2018 0731   GLUCOSEU NEGATIVE 03/15/2018 0731   HGBUR MODERATE (A) 03/15/2018 0731   BILIRUBINUR NEGATIVE 03/15/2018 0731   KETONESUR NEGATIVE 03/15/2018 0731   PROTEINUR NEGATIVE 03/15/2018 0731   NITRITE NEGATIVE 03/15/2018 0731   LEUKOCYTESUR NEGATIVE 03/15/2018 0731    No results found for this or any previous visit (from the past 240 hour(s)).   Radiological Exams on Admission: Dg Chest 2 View  Result Date: 03/15/2018 CLINICAL DATA:  Dizziness, weakness EXAM: CHEST - 2 VIEW COMPARISON:  02/18/2015 FINDINGS: Heart and mediastinal contours are within normal limits. No focal opacities or effusions. No acute bony abnormality. IMPRESSION: No active cardiopulmonary disease.  Electronically Signed   By: Charlett Nose M.D.   On: 03/15/2018 08:58   Ct Head Wo Contrast  Result Date: 03/15/2018 CLINICAL DATA:  Dizziness and weakness. EXAM: CT HEAD WITHOUT CONTRAST TECHNIQUE: Contiguous axial images were obtained from the base of the skull through the vertex without intravenous contrast. COMPARISON:  02/14/2015 FINDINGS: Brain: No evidence of acute infarction, hemorrhage, hydrocephalus, extra-axial collection or mass lesion/mass effect. Vascular: No hyperdense vessel or unexpected calcification. Skull: Normal. Negative for fracture or focal lesion. Sinuses/Orbits: No acute finding. Other: None. IMPRESSION: Normal head CT. Electronically Signed   By: Irish Lack M.D.   On: 03/15/2018 09:22    EKG: Independently reviewed. NSR, no ischemic changes  Assessment/Plan Principal Problem:   TIA (transient ischemic attack) Active Problems:   Tobacco use   HTN (hypertension)   Microscopic hematuria   Insomnia   RUE numbness   Right arm numbness, dizziness, visual abnormalities: Concerning for TIA vs. complicated migraine, possibly vertigo though numbness would not be explained solely by this. With right shoulder/neck pain on exam, may consider radiculopathy, though more proximal source will need to be evaluated first. CT head without acute findings. UDS neg. - Start aspirin - MRI - Carotis U/S - Echocardiogram - Neuro checks -  PT/OT/SLP - Permissive HTN for now, will possibly start an agent prior to discharge - Check lipids and A1c, plan to start statin if CVA rules in or hyperlipidemia. Will need PCP follow up regardless.  - Consider neuro referral vs. myelopathy evaluation for numbness in dermatome C6, partial C7 RUE.  Tobacco use: Only 2 packs per week for the past 4 years.  - Cessation counseling provided - Provide nicotine patch prn  Microscopic hemoglobinuria: No gross hematuria or other urinary symptoms - Recommend recheck as outpatient and appropriate  follow up if microscopic hematuria continues in this smoker.  Insomnia:  - Requests sleep aid, will order trazodone.   DVT prophylaxis: Lovenox  Code Status: Full  Family Communication: None at bedside Disposition Plan: Home once work up complete Consults called: Teleneurology, Dr. Gaynelle Adu, by EDP  Admission status: Observation    Tyrone Nine, MD [facility] [email or web address] Password [email or web address] 03/15/2018, 11:09 AM

## 2018-03-16 ENCOUNTER — Observation Stay (HOSPITAL_COMMUNITY): Payer: Self-pay

## 2018-03-16 DIAGNOSIS — I10 Essential (primary) hypertension: Secondary | ICD-10-CM

## 2018-03-16 DIAGNOSIS — R200 Anesthesia of skin: Secondary | ICD-10-CM

## 2018-03-16 DIAGNOSIS — Z720 Tobacco use: Secondary | ICD-10-CM

## 2018-03-16 DIAGNOSIS — R3129 Other microscopic hematuria: Secondary | ICD-10-CM

## 2018-03-16 DIAGNOSIS — G459 Transient cerebral ischemic attack, unspecified: Secondary | ICD-10-CM

## 2018-03-16 DIAGNOSIS — G4700 Insomnia, unspecified: Secondary | ICD-10-CM

## 2018-03-16 LAB — HEMOGLOBIN A1C
Hgb A1c MFr Bld: 5.4 % (ref 4.8–5.6)
Mean Plasma Glucose: 108.28 mg/dL

## 2018-03-16 LAB — LIPID PANEL
Cholesterol: 185 mg/dL (ref 0–200)
HDL: 36 mg/dL — ABNORMAL LOW (ref >40)
LDL Cholesterol: 126 mg/dL — ABNORMAL HIGH (ref 0–99)
Total CHOL/HDL Ratio: 5.1 RATIO
Triglycerides: 114 mg/dL (ref <150)
VLDL: 23 mg/dL (ref 0–40)

## 2018-03-16 MED ORDER — METHYLPREDNISOLONE SODIUM SUCC 125 MG IJ SOLR
80.0000 mg | Freq: Once | INTRAMUSCULAR | Status: AC
  Administered 2018-03-16: 80 mg via INTRAVENOUS
  Filled 2018-03-16: qty 2

## 2018-03-16 MED ORDER — TRAMADOL HCL 50 MG PO TABS: 50.0000 mg | ORAL_TABLET | Freq: Four times a day (QID) | ORAL | 0 refills | Status: DC | PRN

## 2018-03-16 MED ORDER — SILDENAFIL CITRATE 50 MG PO TABS: 50.0000 mg | ORAL_TABLET | Freq: Every day | ORAL | 0 refills | Status: AC | PRN

## 2018-03-16 MED ORDER — PREDNISONE 20 MG PO TABS: ORAL_TABLET | ORAL | 0 refills | Status: DC

## 2018-03-16 NOTE — Care Management Note (Addendum)
Case Management Note  Patient Details  Name: Malena EdmanJuan Kreiter MRN: [id number] Date of Birth: 23-May-1955  Subjective/Objective:     TIA work up. From home, Independent. Works M-F, 8-12 hours, works half a day on Saturday. Recommended for OP PT for possible nerve impingement. Agreeable to referral but unsure when he could go to appointments. Has insurance waiting on card to come in mail.       [facility] flyer given to help with primary care establishment.          Action/Plan: DC home.   Expected Discharge Date:   03/17/2018               Expected Discharge Plan:  OP Rehab  In-House Referral:     Discharge planning Services  CM Consult  Post Acute Care Choice:    Choice offered to:  Patient  DME Arranged:    DME Agency:     HH Arranged:    HH Agency:     Status of Service:  Completed, signed off  If discussed at [employer] Length of Stay Meetings, dates discussed:    Additional Comments:  Zillah Alexie, Chrystine OilerSharley Diane, RN 03/16/2018, 2:10 PM

## 2018-03-16 NOTE — Evaluation (Signed)
Physical Therapy Evaluation Patient Details Name: Darrell Parker MRN: [id number] DOB: 1955/06/04 Today's Date: 03/16/2018   History of Present Illness  Darrell Parker is a 62 y.o. male with no medical follow up and tobacco use who presented to the ED for abrupt onset of right arm numbness, dizziness and visual changes. 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 After taking some deep breaths this seemed to improve but not resolve, also noting black spots bilaterally in vision described as "gnats flying." He also noticed numbness along the whole right arm including the shoulder that has continued constantly, waxing and waning course with moderate intensity. This has not been improved with any changes in positioning. He was still having these symptoms associated with the sensation that he was going to pass out, so his boss told him to come to the ED. The dizziness and generalized weakness resolved after about 30-40 minutes but the arm remains numb.      Clinical Impression  Patient functioning at baseline for functional mobility and gait other than c/o severe neck pain with c/o numbness in RUE, patient had no problem getting in/out of bed and walking in hallways, stairwell without loss of balance.  Patient c/o increased pain with pressure to right upper trapezius muscle and when flexing neck laterally to the left.  Patient c/o numbness mostly down lateral side of RUE and radial side of right hand to include index finger and thumb, c/o increased numbness with burning like sensation spreading to to middle finger when given mild distraction to neck while seated.  Plan:  Patient discharged from physical therapy to care of nursing for ambulation daily as tolerated for length of stay with follow up recommendations below.    Follow Up Recommendations Outpatient PT(for neck pain  and radiation down RUE)    Equipment Recommendations  None recommended by PT    Recommendations for Other Services       Precautions / Restrictions Precautions Precautions: None Restrictions Weight Bearing Restrictions: No      Mobility  Bed Mobility Overal bed mobility: Independent                Transfers Overall transfer level: Independent                  Ambulation/Gait Ambulation/Gait assistance: Modified independent (Device/Increase time) Gait Distance (Feet): 150 Feet Assistive device: None Gait Pattern/deviations: WFL(Within Functional Limits) Gait velocity: decreased   General Gait Details: grossly WFL except slightly slower than normal cadence  Stairs Stairs: Yes Stairs assistance: Modified independent (Device/Increase time) Stair Management: One rail Right;One rail Left;Alternating pattern Number of Stairs: 9 General stair comments: demonstrates good return for going up/down stairs using 1 siderail without loss of balance  Wheelchair Mobility    Modified Rankin (Stroke Patients Only)       Balance Overall balance assessment: No apparent balance deficits (not formally assessed)                                           Pertinent Vitals/Pain Pain Assessment: 0-10 Pain Score: 7  Pain Location: 7-10/10 in back of neck, right shoulder with 8/10 numbess down RUE to lateral side of hand to include index finger and tumb Pain Descriptors / Indicators: Sore;Sharp;Numbness  Pain Intervention(s): Limited activity within patient's tolerance;Monitored during session    [place] expects to be discharged to:: Private residence Living Arrangements: Children Available Help at Discharge: Family Type of Home: House Home Access: Stairs to enter Entrance Stairs-Rails: Right;Left(to wide to reach both) Technical brewer of Steps: 6-8 Home Layout: One level Home Equipment: Environmental consultant - 2 wheels;Cane - single  point      Prior Function Level of Independence: Independent         Comments: community ambulator, drives     Journalist, newspaper        Extremity/Trunk Assessment   Upper Extremity Assessment Upper Extremity Assessment: Defer to OT evaluation    Lower Extremity Assessment Lower Extremity Assessment: Overall WFL for tasks assessed;RLE deficits/detail;LLE deficits/detail RLE Deficits / Details: grossly -4/5  LLE Deficits / Details: grossly 5/5    Cervical / Trunk Assessment Cervical / Trunk Assessment: Normal  Communication   Communication: No difficulties  Cognition Arousal/Alertness: Awake/alert Behavior During Therapy: WFL for tasks assessed/performed Overall Cognitive Status: Within Functional Limits for tasks assessed                                        General Comments      Exercises     Assessment/Plan    PT Assessment All further PT needs can be met in the next venue of care  PT Problem List Decreased range of motion;Pain(20% decreased left lateral flexion in neck)       PT Treatment Interventions      PT Goals (Current goals can be found in the Care Plan section)  Acute Rehab PT Goals Patient Stated Goal: return home PT Goal Formulation: With patient Time For Goal Achievement: 03/16/18 Potential to Achieve Goals: Good    Frequency     Barriers to discharge        Co-evaluation               AM-PAC PT "6 Clicks" Daily Activity  Outcome Measure Difficulty turning over in bed (including adjusting bedclothes, sheets and blankets)?: None Difficulty moving from lying on back to sitting on the side of the bed? : None Difficulty sitting down on and standing up from a chair with arms (e.g., wheelchair, bedside commode, etc,.)?: None Help needed moving to and from a bed to chair (including a wheelchair)?: None Help needed walking in hospital room?: None Help needed climbing 3-5 steps with a railing? : None 6 Click Score:  24    End of Session   Activity Tolerance: Patient tolerated treatment well Patient left: in bed;with call bell/phone within reach Nurse Communication: Mobility status PT Visit Diagnosis: Unsteadiness on feet (R26.81);Other abnormalities of gait and mobility (R26.89);Muscle weakness (generalized) (M62.81)    Time: 3785-8850 PT Time Calculation (min) (ACUTE ONLY): 23 min   Charges:     PT Treatments $Therapeutic Activity: 23-37 mins        12:25 PM, 03/16/18 Lonell Grandchild, MPT Physical Therapist with [facility] [phone] office [phone] mobile phone

## 2018-03-16 NOTE — Progress Notes (Signed)
OT Cancellation Note  Patient Details Name: Darrell Parker MRN: [id number] DOB: [birth year]   Cancelled Treatment:    Reason Eval/Treat Not Completed: OT screened, no needs identified, will sign off. Pt received supine in bed, reporting headache at back of head going down to neck, has received pain medication without relief. Pt reporting dizziness and visual disturbances have resolved, RUE continues to feel numb or asleep. RUE sensation is WNL, coordination is intact. Pt does present with RUE weakness, MMT at 4-/5 compared to 5/5 in LUE, slightly decreased grip strength. At this time pt is not interested in rehab services, reporting he will wait to see the doctor then [facility] his RUE strength. Pt is independent with ADL completion this am, using BUE for tasks without difficulty. No further OT needs at this time. Informed pt to ask MD for referral to outpatient OT if RUE does not improve and he decides he is interested in rehab services. Thank you for this referral.    Ezra SitesLeslie Troxler, OTR/L  [phone] 03/16/2018, 7:58 AM

## 2018-03-16 NOTE — Discharge Summary (Signed)
Physician Discharge Summary  Darrell Parker ZOX:[id number] DOB: 16-Feb-1956 DOA: 03/15/2018  PCP: Patient, No Pcp Per  Admit date: 03/15/2018 Discharge date: 03/16/2018  Admitted From: Home Disposition: Home   Recommendations for Outpatient Follow-up:  1. Follow up with [facility] in the next week 2. Reestablish care with PCP in 1-2 weeks. 3. Recheck urinalysis for microscopic hematuria. 4. Consider lipid lowering agent if lifestyle modifications do not improve LDL.  Home Health: None; outpatient PT/OT Equipment/Devices: None Discharge Condition: Stable CODE STATUS: Full Diet recommendation: Heart healthy  Brief/Interim Summary: Darrell Parker is a 62 y.o. male with no medical follow up and tobacco use who presented to the ED for abrupt onset of right arm numbness, dizziness and visual changes. 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 After taking some deep breaths this seemed to improve but not resolve, also noting black spots bilaterally in vision described as "gnats flying." He also noticed numbness along the whole right arm including the shoulder that has continued constantly, waxing and waning course with moderate intensity. This has not been improved with any changes in positioning. He was still having these symptoms associated with the sensation that he was going to pass out, so his boss told him to come to the ED. The dizziness and generalized weakness resolved after about 30-40 minutes but the arm remained numb.    ED Course: Mildly hypertensive, otherwise VSS. Labs (CBC, CMP, troponin, EtOH level) unremarkable. Urinalysis without pyuria, but noticed hemoglobinuria.   Hospital Course: MRI showed no acute stroke and further work up showed no secondary cause of stroke. The right upper extremity continued to have numbness and he  developed worsening right shoulder and neck pain worse with movement. Exam was stable with abnormal RUE sensation and only very subtle weakness. MRI of the cervical spine demonstrated severe foraminal stenosis. [facility] was consulted and recommended steroids and will arrange office follow up in the next few days.  Discharge Diagnoses:  Principal Problem:   TIA (transient ischemic attack) Active Problems:   Tobacco use   HTN (hypertension)   Microscopic hematuria   Insomnia   RUE numbness  TIA: Symptoms largely resolved with the exception of RUE radiculopathy. MRI showed no stroke. No significant carotid stenosis, no PFO or CES.  - Outpatient PT and OT - Follow up with neurology in 6-8 weeks - Follow up with PCP for further risk reduction.   Right C6-C7 foraminal stenosis with radiculopathy: With persistent numbness and very modest weakness for 2 days. Discussed with [name], PA with [facility] who reviewed the case and MRI. Recommends close office follow up which his office will arrange and steroids empirically.  - Was given solumedrol while inpatient, will continue with medrol dose pack.  - Given office number to call if not contacted by Monday.   Tobacco use: Only 2 packs per week for the past 4 years.  - Cessation counseling provided  Microscopic hemoglobinuria: No gross hematuria or other urinary symptoms - Recommend recheck as outpatient and appropriate follow up if microscopic hematuria continues in this smoker.  Insomnia:  - PCP follow up  ED: Very troublesome to the patient. Will give trial of medication but needs PCP follow up.  Obesity: BMI 31. Counseled.   Discharge Instructions Discharge Instructions    Ambulatory referral to Physical Therapy   Complete by:  As  directed    Diet - low sodium heart healthy   Complete by:  As directed    Discharge instructions   Complete by:  As directed    You were admitted for symptoms mimicking a  stroke but did not have evidence of a stroke on imaging. You still need to follow up with a primary doctor as soon as possible to reduce your risk of future strokes.   Your right arm pain and numbness is due to arthritis causing some nerve impingement. This was discussed with [facility]. They recommend close office follow up. Their information is listed. If you haven't heard from their office by the end of the day tomorrow, please give them a call to schedule an appointment early this next week. In the mean time, take prednisone taper as directed to help with inflammation. You may also take tramadol as needed for severe pain. These prescriptions were sent to your pharmacy.   If you develop worsening weakness in the right arm, seek medical attention right away.   Increase activity slowly   Complete by:  As directed      Allergies as of 03/16/2018      Reactions   Motrin [ibuprofen] Hives, Swelling   Per patient can take asa   Tylenol [acetaminophen] Itching      Medication List    TAKE these medications   predniSONE 20 MG tablet Commonly known as:  DELTASONE Take 60mg  x 2 days, 40mg  x 2 days, 20mg  x 3 days   sildenafil 50 MG tablet Commonly known as:  VIAGRA Take 1 tablet (50 mg total) by mouth daily as needed for erectile dysfunction.   traMADol 50 MG tablet Commonly known as:  ULTRAM Take 1 tablet (50 mg total) by mouth every 6 (six) hours as needed for severe pain.      Follow-up Information    Costella, Darci Current, PA-C. Schedule an appointment as soon as possible for a visit on 03/20/2018.   Specialty:  Physician Assistant Contact information: [address] [place] [address] [phone]          Allergies  Allergen Reactions  . Motrin [Ibuprofen] Hives and Swelling    Per patient can take asa  . Tylenol [Acetaminophen] Itching    Consultations:  [facility], [name], [place]  Procedures/Studies: Dg Chest 2 View  Result  Date: 03/15/2018 CLINICAL DATA:  Dizziness, weakness EXAM: CHEST - 2 VIEW COMPARISON:  02/18/2015 FINDINGS: Heart and mediastinal contours are within normal limits. No focal opacities or effusions. No acute bony abnormality. IMPRESSION: No active cardiopulmonary disease. Electronically Signed   By: Charlett Nose M.D.   On: 03/15/2018 08:58   Ct Head Wo Contrast  Result Date: 03/15/2018 CLINICAL DATA:  Dizziness and weakness. EXAM: CT HEAD WITHOUT CONTRAST TECHNIQUE: Contiguous axial images were obtained from the base of the skull through the vertex without intravenous contrast. COMPARISON:  02/14/2015 FINDINGS: Brain: No evidence of acute infarction, hemorrhage, hydrocephalus, extra-axial collection or mass lesion/mass effect. Vascular: No hyperdense vessel or unexpected calcification. Skull: Normal. Negative for fracture or focal lesion. Sinuses/Orbits: No acute finding. Other: None. IMPRESSION: Normal head CT. Electronically Signed   By: Irish Lack M.D.   On: 03/15/2018 09:22   Mr Brain Wo Contrast  Result Date: 03/15/2018 CLINICAL DATA:  Right arm numbness.  Dizziness and vision change EXAM: MRI HEAD WITHOUT CONTRAST TECHNIQUE: Multiplanar, multiecho pulse sequences of the brain and surrounding structures were obtained without intravenous contrast. COMPARISON:  CT head  03/15/2018 FINDINGS: Brain: Negative for acute infarct. Mild chronic white matter changes. Ventricle size and cerebral volume normal. Negative for hemorrhage or mass lesion. Vascular: Normal arterial flow voids Skull and upper cervical spine: Negative Sinuses/Orbits: Negative Other: None IMPRESSION: No acute abnormality. Mild chronic microvascular ischemic change in the white matter. Electronically Signed   By: Marlan Palauharles  Clark M.D.   On: 03/15/2018 15:39   Mr Cervical Spine Wo Contrast  Result Date: 03/16/2018 CLINICAL DATA:  Neck pain radiating down the arms for 2 days. No known injury. EXAM: MRI CERVICAL SPINE WITHOUT CONTRAST  TECHNIQUE: Multiplanar, multisequence MR imaging of the cervical spine was performed. No intravenous contrast was administered. COMPARISON:  None. FINDINGS: Alignment: Physiologic. Vertebrae: No fracture, evidence of discitis, or bone lesion. Cord: Normal signal and morphology. Posterior Fossa, vertebral arteries, paraspinal tissues: Posterior fossa demonstrates no focal abnormality. Vertebral artery flow voids are maintained. Paraspinal soft tissues are unremarkable. Disc levels: Discs: Degenerative disease with disc height loss at C5-6 and C6-7. C2-3: No significant disc bulge. No neural foraminal stenosis. No central canal stenosis. C3-4: Broad-based disc bulge. Mild bilateral facet arthropathy and right uncovertebral degenerative changes. Mild right foraminal stenosis. No left foraminal stenosis. No central canal stenosis. C4-5: Broad-based disc bulge with a small central disc protrusion. Mild right facet arthropathy. No foraminal stenosis. No central canal stenosis. C5-6: Broad-based disc osteophyte complex. Bilateral uncovertebral degenerative changes. Moderate bilateral foraminal stenosis. No central canal stenosis. C6-7: Broad-based disc osteophyte complex. Bilateral uncovertebral degenerative changes. Severe right and moderate left foraminal stenosis. No central canal stenosis. C7-T1: No significant disc bulge. No neural foraminal stenosis. No central canal stenosis. IMPRESSION: 1. Diffuse cervical spine spondylosis as described above. 2.  No acute osseous injury of the cervical spine. Electronically Signed   By: Elige KoHetal  Patel   On: 03/16/2018 12:14   [place] Carotid Bilateral (at [facility] And [facility] Only)  Result Date: 03/15/2018 CLINICAL DATA:  Vertigo, right upper extremity numbness, visual disturbance, syncope, hypertension and tobacco use. EXAM: BILATERAL CAROTID DUPLEX ULTRASOUND TECHNIQUE: Wallace CullensGray scale imaging, color Doppler and duplex ultrasound were performed of bilateral carotid and vertebral arteries in the  neck. COMPARISON:  None. FINDINGS: Criteria: Quantification of carotid stenosis is based on velocity parameters that correlate the residual internal carotid diameter with NASCET-based stenosis levels, using the diameter of the distal internal carotid lumen as the denominator for stenosis measurement. The following velocity measurements were obtained: RIGHT ICA:  84/22 cm/sec CCA:  98/13 cm/sec SYSTOLIC ICA/CCA RATIO:  0.7 ECA:  125 cm/sec LEFT ICA:  73/28 cm/sec CCA:  101/23 cm/sec SYSTOLIC ICA/CCA RATIO:  0.7 ECA:  95 cm/sec RIGHT CAROTID ARTERY: There is a mild amount of partially calcified plaque at the level of the carotid bulb and proximal right ICA. Estimated right ICA stenosis is less than 50%. RIGHT VERTEBRAL ARTERY: Antegrade flow with normal waveform and velocity. LEFT CAROTID ARTERY: Minimal amount of partially calcified plaque present at the level of the distal bulb and proximal left ICA. Estimated left ICA stenosis is less than 50%. LEFT VERTEBRAL ARTERY: Antegrade flow with normal waveform and velocity. IMPRESSION: Mild amount of plaque at the level of both carotid bulbs and proximal internal carotid arteries, right greater than left. No significant carotid stenosis identified with estimated bilateral ICA stenoses of less than 50%. Electronically Signed   By: Irish LackGlenn  Yamagata M.D.   On: 03/15/2018 15:07    Subjective: Having neck, right shoulder pain radiating down the arm, worse with some movements of the neck. Denies weakness,  though this is his dominant hand. No dizziness or other focal deficits, ambulating normally.   Discharge Exam: Vitals:   03/16/18 0830 03/16/18 1230  BP: 114/69 (!) 143/83  Pulse: 65 67  Resp: 18 18  Temp: 98.4 F (36.9 C) 98 F (36.7 C)  SpO2: 96% 96%   General: Pt is alert, awake, not in acute distress Cardiovascular: No edema, no cyanosis, RRR, S1/S2 +, no rubs, no gallops Respiratory: CTA bilaterally, no wheezing, no rhonchi Abdominal: Soft, NT, ND, bowel  sounds + Extremities: CN II-XII grossly intact, no facial numbness reported. Gait normal, narrow based, speech normal. RUE with numbness along lateral RUE extending to thumb and index finger, involving anterior upper arm. +Spurlings, +pain with distraction. Grip strength very subtley decreased compared to left. No shoulder or elbow weakness.  Labs: BNP (last 3 results) No results for input(s): BNP in the last 8760 hours. Basic Metabolic Panel: Recent Labs  Lab 03/15/18 0750  NA 138  K 4.1  CL 107  CO2 24  GLUCOSE 97  BUN 9  CREATININE 0.72  CALCIUM 8.5*   Liver Function Tests: Recent Labs  Lab 03/15/18 0750  AST 19  ALT 22  ALKPHOS 85  BILITOT 0.7  PROT 6.8  ALBUMIN 3.9   No results for input(s): LIPASE, AMYLASE in the last 168 hours. No results for input(s): AMMONIA in the last 168 hours. CBC: Recent Labs  Lab 03/15/18 0750  WBC 7.1  NEUTROABS 4.6  HGB 14.3  HCT 43.5  MCV 90.6  PLT 298   Cardiac Enzymes: Recent Labs  Lab 03/15/18 0750  TROPONINI <0.03   BNP: Invalid input(s): POCBNP CBG: No results for input(s): GLUCAP in the last 168 hours. D-Dimer No results for input(s): DDIMER in the last 72 hours. Hgb A1c Recent Labs    03/16/18 0550  HGBA1C 5.4   Lipid Profile Recent Labs    03/16/18 0551  CHOL 185  HDL 36*  LDLCALC 126*  TRIG 114  CHOLHDL 5.1   Thyroid function studies No results for input(s): TSH, T4TOTAL, T3FREE, THYROIDAB in the last 72 hours.  Invalid input(s): FREET3 Anemia work up No results for input(s): VITAMINB12, FOLATE, FERRITIN, TIBC, IRON, RETICCTPCT in the last 72 hours. Urinalysis    Component Value Date/Time   COLORURINE STRAW (A) 03/15/2018 0731   APPEARANCEUR CLEAR 03/15/2018 0731   LABSPEC 1.012 03/15/2018 0731   PHURINE 6.0 03/15/2018 0731   GLUCOSEU NEGATIVE 03/15/2018 0731   HGBUR MODERATE (A) 03/15/2018 0731   BILIRUBINUR NEGATIVE 03/15/2018 0731   KETONESUR NEGATIVE 03/15/2018 0731   PROTEINUR  NEGATIVE 03/15/2018 0731   NITRITE NEGATIVE 03/15/2018 0731   LEUKOCYTESUR NEGATIVE 03/15/2018 0731    Microbiology No results found for this or any previous visit (from the past 240 hour(s)).  Time coordinating discharge: Approximately 40 minutes  Tyrone Nine, MD  [facility] 03/16/2018, 2:46 PM Pager [phone]

## 2018-03-16 NOTE — Progress Notes (Signed)
IV removed by Shanda BumpsJessica, NT, 2x2 gauze and paper tape applied to site, patient tolerated well.  Reviewed AVS with patient who verbalized understanding.  Work note given to patient.  Patient encouraged to follow-up with [facility] within the next week and to take prednisone as ordered.

## 2018-03-17 LAB — HIV ANTIBODY (ROUTINE TESTING W REFLEX): HIV Screen 4th Generation wRfx: NONREACTIVE

## 2019-12-04 IMAGING — MR MR CERVICAL SPINE W/O CM
4 of 5 series · 14 of 48 positions shown · non-contrast
Comparison: None.

CLINICAL DATA: Neck pain radiating down the arms for 2 days. No
known injury.

EXAM:
MRI CERVICAL SPINE WITHOUT CONTRAST
TECHNIQUE: Multiplanar, multisequence MR imaging of the cervical spine was
performed. No intravenous contrast was administered.

[Series 3: T2 · sagittal · 3.0mm · 0.47mm/px · 5 of 15 slices shown (1 of 2)]
[im 1/15]
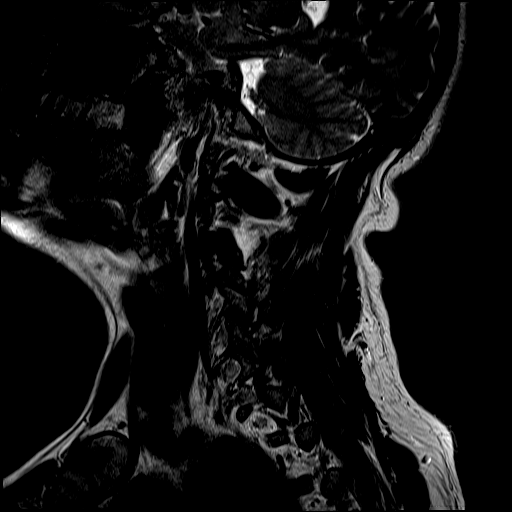
[im 3/15]
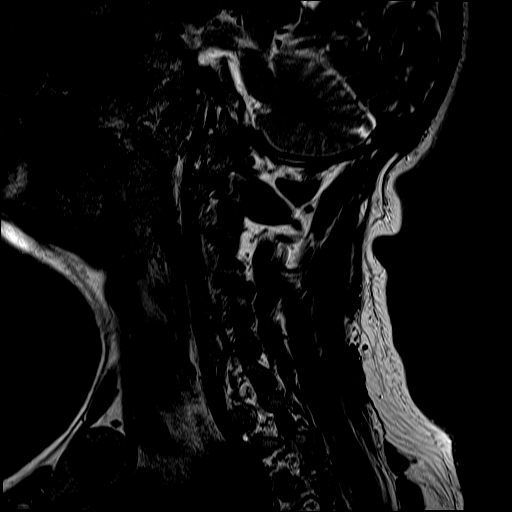
[im 5/15]
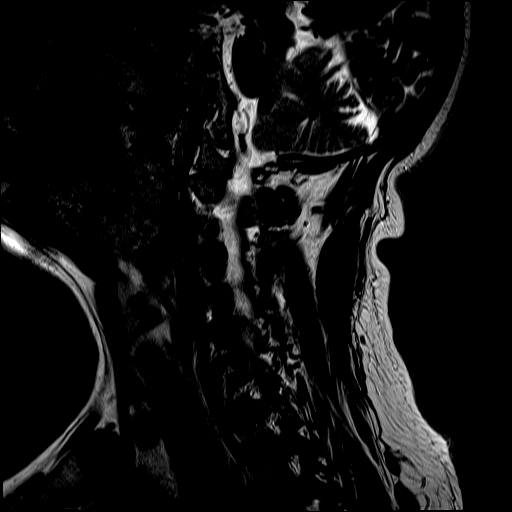
[im 8/15]
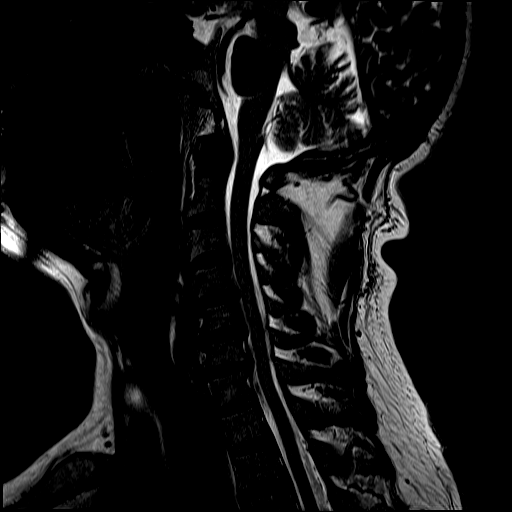
[im 12/15]
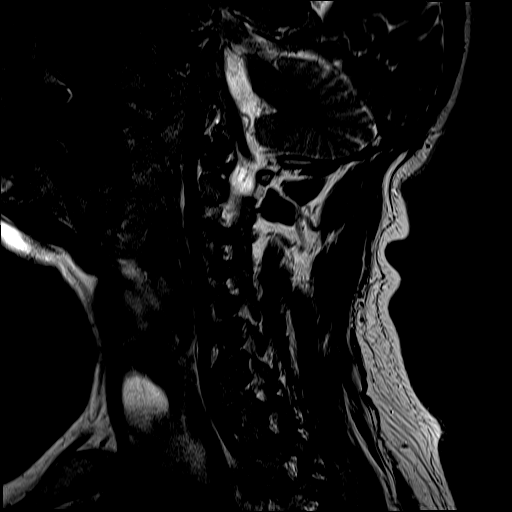

[Series 4: FLAIR · sagittal · 3.0mm · 0.54mm/px · 3 of 15 slices shown]
[im 3/15]
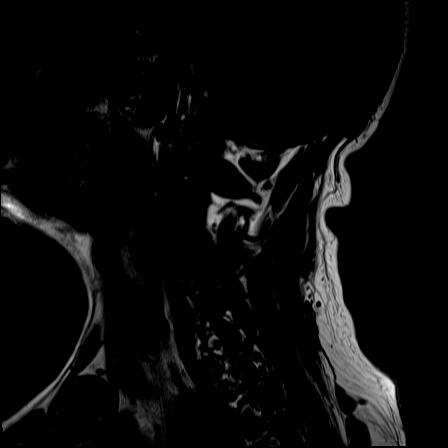
[im 8/15]
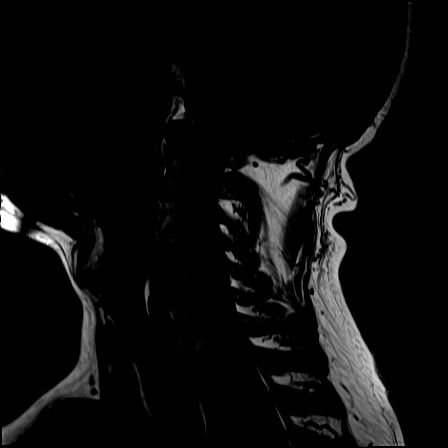
[im 12/15]
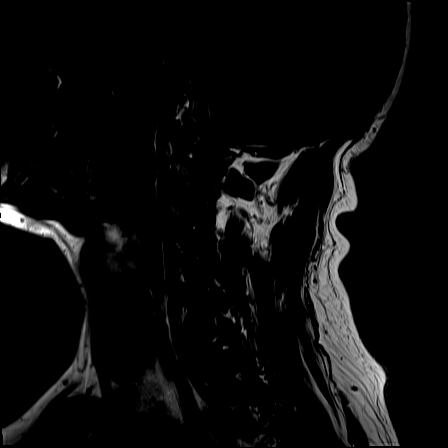

[Series 5: ir sagital · sagittal · 3.0mm · 0.27mm/px · 3 of 15 slices shown]
[im 3/15]
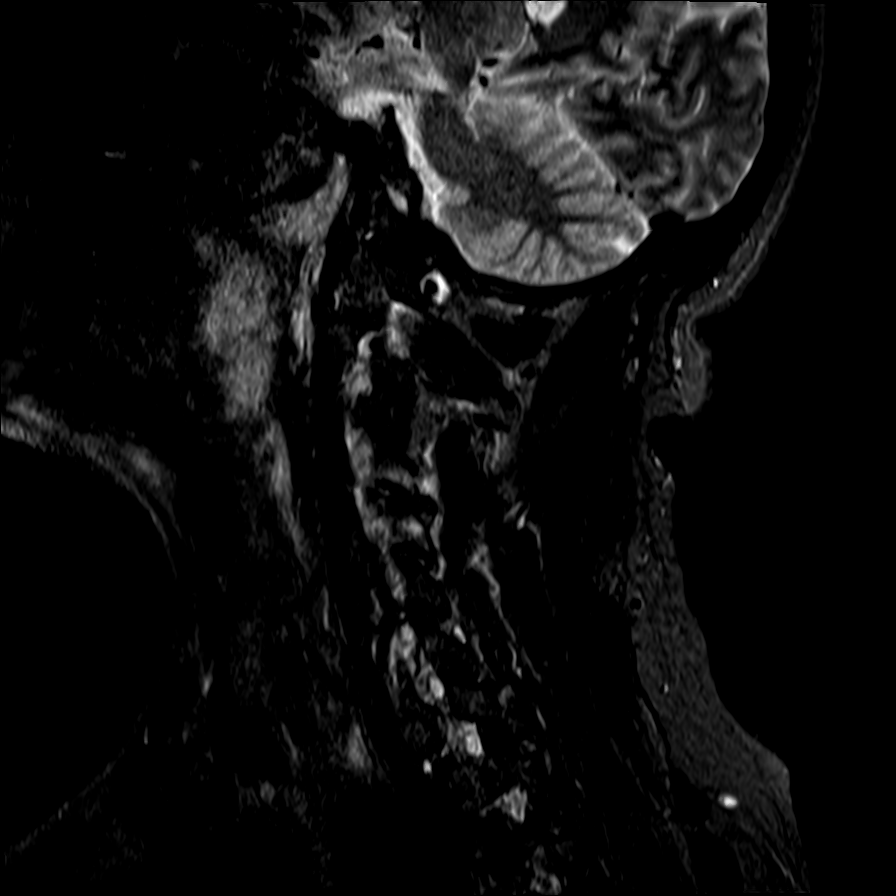
[im 9/15]
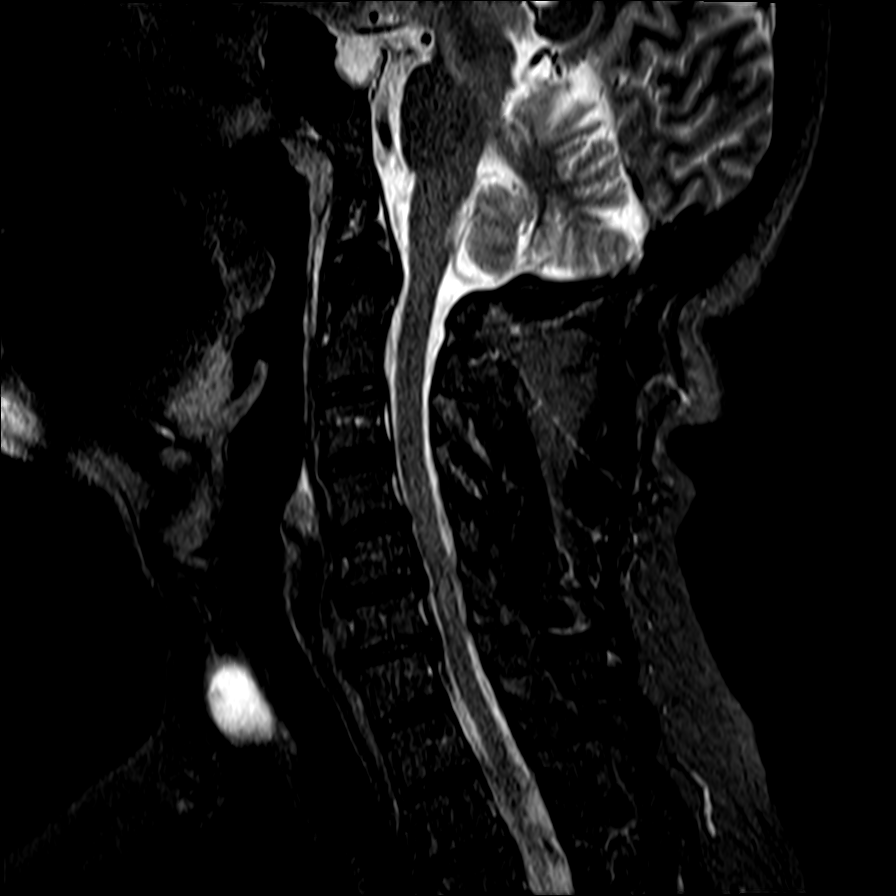
[im 15/15]
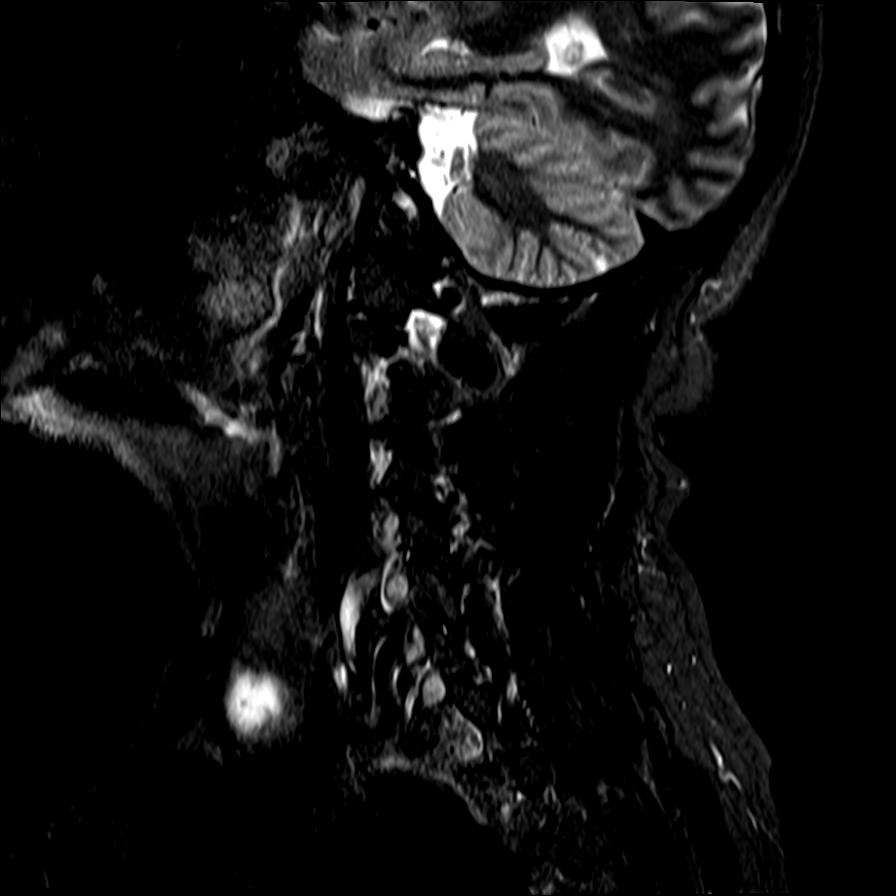

[Series 7: T2 · axial · 3.0mm · 0.21mm/px · z∈[-75,-5]mm · 3 of 34 slices shown (2 of 2)]
[im 6/34]
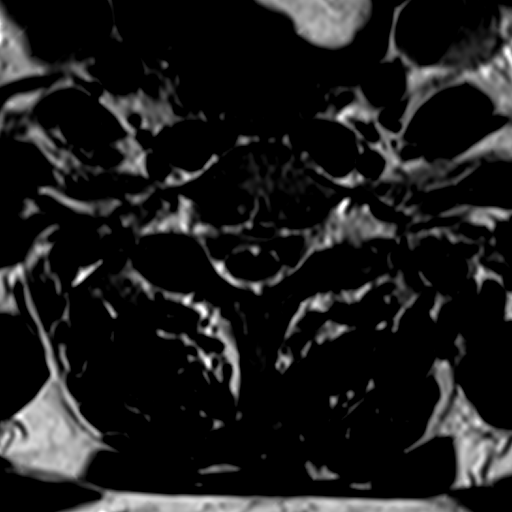
[im 18/34]
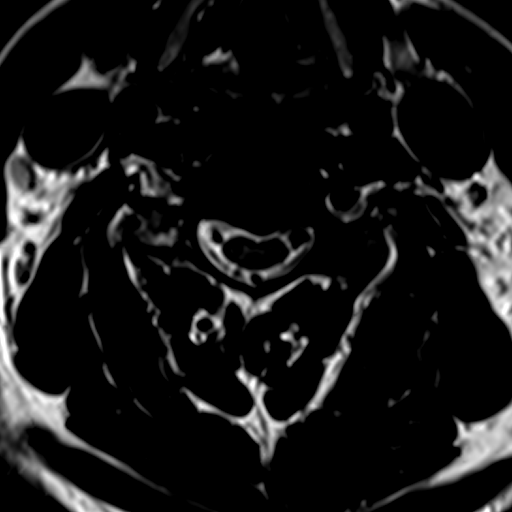
[im 28/34]
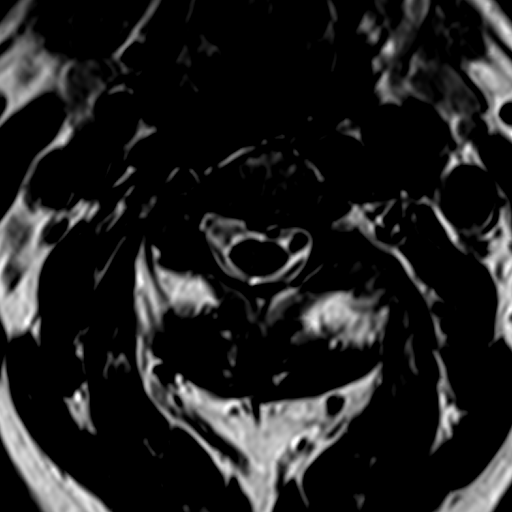

[14 of 48 positions shown; findings below may reference images not displayed]

FINDINGS: Alignment: Physiologic.

Vertebrae: No fracture, evidence of discitis, or bone lesion.

Cord: Normal signal and morphology.

Posterior Fossa, vertebral arteries, paraspinal tissues: Posterior
fossa demonstrates no focal abnormality. Vertebral artery flow voids
are maintained. Paraspinal soft tissues are unremarkable.

Disc levels:

Discs: Degenerative disease with disc height loss at C5-6 and C6-7.

C2-3: No significant disc bulge. No neural foraminal stenosis. No
central canal stenosis.

C3-4: Broad-based disc bulge. Mild bilateral facet arthropathy and
right uncovertebral degenerative changes. Mild right foraminal
stenosis. No left foraminal stenosis. No central canal stenosis.

C4-5: Broad-based disc bulge with a small central disc protrusion.
Mild right facet arthropathy. No foraminal stenosis. No central
canal stenosis.

C5-6: Broad-based disc osteophyte complex. Bilateral uncovertebral
degenerative changes. Moderate bilateral foraminal stenosis. No
central canal stenosis.

C6-7: Broad-based disc osteophyte complex. Bilateral uncovertebral
degenerative changes. Severe right and moderate left foraminal
stenosis. No central canal stenosis.

C7-T1: No significant disc bulge. No neural foraminal stenosis. No
central canal stenosis.
IMPRESSION: 1. Diffuse cervical spine spondylosis as described above.
2.  No acute osseous injury of the cervical spine.

## 2021-12-03 ENCOUNTER — Emergency Department (HOSPITAL_COMMUNITY): Payer: Self-pay

## 2021-12-03 ENCOUNTER — Other Ambulatory Visit: Payer: Self-pay

## 2021-12-03 ENCOUNTER — Encounter (HOSPITAL_COMMUNITY): Payer: Self-pay

## 2021-12-03 ENCOUNTER — Emergency Department (HOSPITAL_COMMUNITY)
Admission: EM | Admit: 2021-12-03 | Discharge: 2021-12-03 | Disposition: A | Discharge status: Home / Self Care | Payer: Self-pay | Attending: Student | Admitting: Student

## 2021-12-03 DIAGNOSIS — Y990 Civilian activity done for income or pay: Secondary | ICD-10-CM | POA: Insufficient documentation

## 2021-12-03 DIAGNOSIS — S8392XA Sprain of unspecified site of left knee, initial encounter: Secondary | ICD-10-CM | POA: Insufficient documentation

## 2021-12-03 DIAGNOSIS — X58XXXA Exposure to other specified factors, initial encounter: Secondary | ICD-10-CM | POA: Insufficient documentation

## 2021-12-03 DIAGNOSIS — Z020 Encounter for examination for admission to educational institution: Secondary | ICD-10-CM | POA: Insufficient documentation

## 2021-12-03 NOTE — ED Triage Notes (Signed)
Pt out of work for left knee injury. Pt needs work note to go back to work.

## 2021-12-03 NOTE — ED Provider Notes (Signed)
[facility] EMERGENCY DEPARTMENT Provider Note   CSN: [id number] Arrival date & time: 12/03/21  1318     History  Chief Complaint  Patient presents with   Leg Injury   Letter for School/Work    Darrell Parker is a 66 y.o. male.  HPI     Darrell Parker is a 66 y.o. male who presents to the Emergency Department requesting evaluation of a left knee injury that occurred 2 days ago.  He states that this was a work-related injury.  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  He states the impact was minimal, but he had some mild swelling of his knee.  The incident was reported to his supervisor who requested he be medically evaluated before returning to work.  Patient states that his knee feels better and the swelling has resolved.  He is requesting a note to return back to work.  He denies any current swelling of his knee, pain or difficulty with standing or walking.  He does note having a clicking sound to his knee with full flexion.    Home Medications Prior to Admission medications   Medication Sig Start Date End Date Taking? Authorizing Provider  predniSONE (DELTASONE) 20 MG tablet Take 60mg  x 2 days, 40mg  x 2 days, 20mg  x 3 days 03/16/18   , MD  sildenafil (VIAGRA) 50 MG tablet Take 1 tablet (50 mg total) by mouth daily as needed for erectile dysfunction. 03/16/18   03/18/18, MD  traMADol (ULTRAM) 50 MG tablet Take 1 tablet (50 mg total) by mouth every 6 (six) hours as needed for severe pain. 03/16/18   03/18/18, MD      Allergies    Motrin [ibuprofen] and Tylenol [acetaminophen]    Review of Systems   Review of Systems  Constitutional:  Negative for chills and fever.  Respiratory:  Negative for chest tightness and shortness of breath.   Cardiovascular:  Negative for chest pain.  Musculoskeletal:  Positive for arthralgias (Left knee injury swelling). Negative for back pain and neck  pain.  Skin:  Negative for color change, rash and wound.  Neurological:  Negative for dizziness, numbness and headaches.    Physical Exam Updated Vital Signs BP (!) 152/122 (BP Location: Right Arm)   Pulse 86   Temp 97.9 F (36.6 C) (Oral)   Resp 20   SpO2 100%  Physical Exam Vitals and nursing note reviewed.  Constitutional:      General: He is not in acute distress.    Appearance: Normal appearance. He is not toxic-appearing.  Cardiovascular:     Rate and Rhythm: Normal rate and regular rhythm.     Pulses: Normal pulses.  Pulmonary:     Effort: Pulmonary effort is normal.     Breath sounds: Normal breath sounds.  Musculoskeletal:        General: Signs of injury present. No swelling or tenderness.     Left knee: Crepitus present. No swelling, effusion or bony tenderness. Normal range of motion. No tenderness. No LCL laxity or MCL laxity.Normal meniscus. Normal pulse.     Right lower leg: No edema.     Left lower leg: No edema.     Comments: Crepitus with range of motion of the left knee.  No palpable effusion, excessive warmth or erythema.  No pain with valgus and varus stress.  Skin:    General:  Skin is warm.     Capillary Refill: Capillary refill takes less than 2 seconds.     Findings: No erythema or rash.  Neurological:     Mental Status: He is alert.     ED Results / Procedures / Treatments   Labs (all labs ordered are listed, but only abnormal results are displayed) Labs Reviewed - No data to display  EKG None  Radiology DG Knee Complete 4 Views Left  Result Date: 12/03/2021 CLINICAL DATA:  Palate hit knee at work. EXAM: LEFT KNEE - COMPLETE 4+ VIEW COMPARISON:  None Available. FINDINGS: There is no acute fracture or dislocation. Knee alignment is normal. There is minimal superior patellar enthesopathy. There is mild medial and lateral compartment joint space narrowing with minimal associated osteophytes along the lateral compartment. The soft tissues are  unremarkable. There is no effusion. IMPRESSION: No acute fracture or dislocation. Electronically Signed   By: Lesia Hausen M.D.   On: 12/03/2021 14:23    Procedures Procedures    Medications Ordered in ED Medications - No data to display  ED Course/ Medical Decision Making/ A&P                           Medical Decision Making Patient here requesting note to return back to work after a work-related injury in which a pallet struck the lateral portion of his left knee.  He had some initial swelling that has since resolved.  He states his knee feels good and he is wanting to return to work.  He is ambulatory in the department with a steady gait.  He is able to perform full range of motion of the knee he does have some crepitus on full flexion, I do not appreciate any ligamentous instability.  X-ray of the knee without evidence of fracture or dislocation and no effusion.  Differential would include fracture, dislocation, musculoskeletal injury, ligamentous injury.  No concerning symptoms or previous symptoms to suggest septic joint.  Likely sprain of the knee, knee sleeve applied to wear for comfort and support.  He will continue symptomatic treatment with ice and Tylenol if needed.  We will give follow-up information for orthopedics if needed.  Patient appears appropriate for discharge home, all questions answered.  Will provide return to work note    Amount and/or Complexity of Data Reviewed Radiology: ordered.    Details: X-ray without evidence of fracture or dislocation, no effusion.           Final Clinical Impression(s) / ED Diagnoses Final diagnoses:  Sprain of left knee, unspecified ligament, initial encounter    Rx / DC Orders ED Discharge Orders     None         Pauline Aus, PA-C 12/03/21 1440    Glendora Score, MD 12/03/21 2145

## 2021-12-03 NOTE — Discharge Instructions (Signed)
You likely have a sprain of your knee.  You may wear the knee brace as needed for comfort or support.  You may apply ice packs on and off to your knee if needed.  Tylenol for pain.  Follow-up with the orthopedic provider listed if needed

## 2022-01-06 ENCOUNTER — Encounter (HOSPITAL_COMMUNITY): Payer: Self-pay

## 2022-01-06 ENCOUNTER — Emergency Department (HOSPITAL_COMMUNITY)

## 2022-01-06 ENCOUNTER — Emergency Department (HOSPITAL_COMMUNITY)
Admission: EM | Admit: 2022-01-06 | Discharge: 2022-01-06 | Disposition: A | Discharge status: Home / Self Care | Attending: Emergency Medicine | Admitting: Emergency Medicine

## 2022-01-06 ENCOUNTER — Other Ambulatory Visit: Payer: Self-pay

## 2022-01-06 DIAGNOSIS — Y990 Civilian activity done for income or pay: Secondary | ICD-10-CM | POA: Insufficient documentation

## 2022-01-06 DIAGNOSIS — S6992XA Unspecified injury of left wrist, hand and finger(s), initial encounter: Secondary | ICD-10-CM

## 2022-01-06 DIAGNOSIS — S60222A Contusion of left hand, initial encounter: Secondary | ICD-10-CM | POA: Diagnosis not present

## 2022-01-06 DIAGNOSIS — W228XXA Striking against or struck by other objects, initial encounter: Secondary | ICD-10-CM | POA: Diagnosis not present

## 2022-01-06 MED ORDER — TRAMADOL HCL 50 MG PO TABS
50.0000 mg | ORAL_TABLET | Freq: Once | ORAL | Status: AC
  Administered 2022-01-06: 50 mg via ORAL
  Filled 2022-01-06: qty 1

## 2022-01-06 NOTE — Discharge Instructions (Signed)
    [facility] Doctor List    Margaret Simpson, MD. Specialty: Family Medicine Contact information: [address]  [place] [address]  [phone]   Scott Luking, MD. Specialty: Family Medicine Contact information: [address]  [address]  [place] [address]  [phone]   Tesfaye Fanta, MD Specialty: Internal Medicine Contact information: [address]  [place] [address]  [phone]   Zach Hall, MD. Specialty: Internal Medicine Contact information: [address]  [place] [address]  [phone]    [facility] (Dr. Kim) Specialty: Family Medicine Contact information: [address]  [place] [address]  [phone]   Stephen Knowlton, MD. Specialty: Family Medicine Contact information: [address]  [address]  [place] [address]  [phone]   Roy Fagan, MD. Specialty: Internal Medicine Contact information: [address]  [address]  [place] [address]  [phone]    [facility]  [address] [place], [place] [address] [phone]  Services [facility] offers a variety of basic health services.  Services include but are not limited to: Blood pressure checks  Heart rate checks  Blood sugar checks  Urine analysis  Rapid strep tests  Pregnancy tests.  Health education and referrals  People needing more complex services will be directed to a physician online. Using these virtual visits, doctors can evaluate and prescribe medicine and treatments. There will be no medication on-site, though [facility] will help patients fill their prescriptions at little to no cost.   For More information please go to: [email or web address]  

## 2022-01-06 NOTE — ED Provider Notes (Signed)
[facility] EMERGENCY DEPARTMENT Provider Note   CSN: [id number] Arrival date & time: 01/06/22  1853     History  Chief Complaint  Patient presents with   Hand Injury    Darrell Parker is a 66 y.o. male.  Patient is a 66 year old male, no pertinent past medical history, who presents to the ED secondary to injury to his left hand around 3 PM today.  States that he was at work, standing on the conveyor belt and was hit by the corner of a heavy box with product in it. States his hand has continued to swell and his boss instructed him to come to the ER to be further evaluated. He is having difficulty moving his 3rd+4th digits. Pain is around wrist and the back of his hand. Has not taken anything for the pain and has not iced the area. Denies any changes of color or coolness of the hand.    Hand Injury      Home Medications Prior to Admission medications   Medication Sig Start Date End Date Taking? Authorizing Provider  predniSONE (DELTASONE) 20 MG tablet Take 60mg  x 2 days, 40mg  x 2 days, 20mg  x 3 days 03/16/18   , MD  sildenafil (VIAGRA) 50 MG tablet Take 1 tablet (50 mg total) by mouth daily as needed for erectile dysfunction. 03/16/18   03/18/18, MD  traMADol (ULTRAM) 50 MG tablet Take 1 tablet (50 mg total) by mouth every 6 (six) hours as needed for severe pain. 03/16/18   03/18/18, MD      Allergies    Motrin [ibuprofen] and Tylenol [acetaminophen]    Review of Systems   Review of Systems  Musculoskeletal:        L hand pain    Physical Exam Updated Vital Signs BP (!) 147/79 (BP Location: Right Arm)   Pulse 94   Temp 98.4 F (36.9 C) (Oral)   Resp 18   Ht 5\' 8"  (1.727 m)   Wt 97.5 kg   SpO2 96%   BMI 32.69 kg/m  Physical Exam Constitutional:      Appearance: Normal appearance.  Eyes:     Extraocular Movements: Extraocular movements intact.     Pupils: Pupils are equal, round, and reactive to light.  Cardiovascular:     Pulses: Normal  pulses.  Pulmonary:     Effort: Pulmonary effort is normal.  Musculoskeletal:        General: Swelling and tenderness present.     Comments: No scaphoid tenderness of L hand. Able to make OK sign. No active flexion of digits 3+4, able to passively flex. TTP of dorsal aspect of left hand and along the wrist.   Skin:    General: Skin is warm.  Neurological:     General: No focal deficit present.     Mental Status: He is alert.     ED Results / Procedures / Treatments   Labs (all labs ordered are listed, but only abnormal results are displayed) Labs Reviewed - No data to display  EKG None  Radiology DG Forearm Left  Result Date: 01/06/2022 CLINICAL DATA:  hand trauma Pt pt states that he smashed his left hand at work between a cart and conveyer belt. c/o pain to left middle and ring fingers, and swelling to left hand and left forearm. EXAM: LEFT FOREARM - 2 VIEW COMPARISON:  None Available. FINDINGS: There is no evidence of fracture or other focal bone lesions. Soft tissues  are unremarkable. IMPRESSION: Negative. Electronically Signed   By: Tish Frederickson M.D.   On: 01/06/2022 19:44   DG Hand Complete Left  Result Date: 01/06/2022 CLINICAL DATA:  hand trauma EXAM: LEFT HAND - COMPLETE 3+ VIEW COMPARISON:  None Available. FINDINGS: No acute displaced fracture or dislocation. Severe degenerative changes of the second, third and fifth distal interphalangeal joints. At least moderate degenerative changes of the fifth digit proximal interphalangeal joint. Mild first carpometacarpal joint degenerative changes. 2 mm radiopaque density overlying the first digit at the level of the proximal phalanx neck may represent retained radiopaque foreign body. IMPRESSION: 1. No acute displaced fracture or dislocation of the bones of the left hand. 2. A 2 mm radiopaque density overlying the first digit at the level of the proximal phalanx neck may represent retained radiopaque foreign body. Electronically  Signed   By: Tish Frederickson M.D.   On: 01/06/2022 19:43    Procedures Procedures    Medications Ordered in ED Medications  traMADol (ULTRAM) tablet 50 mg (has no administration in time range)    ED Course/ Medical Decision Making/ A&P                           Medical Decision Making Amount and/or Complexity of Data Reviewed Radiology: ordered.  Risk Prescription drug management.   Patient is a 66 year old male, here for left hand pain, x-rays found to be negative for any kind of acute fracture.  He is able to have range of motion, no scaphoid tenderness.  Difficulty with flexion of digits 3 and 4 active, but passive and tach.  Believe possible nerve injury secondary to compression/swelling.  Discussed follow-up with PCP, treatment of Tylenol-rash as patient is allergic to any kind of ibuprofen or NSAIDs.  Discussed follow-up with PCP, PCP list provided.  Likely simple contusion/hand injury.  Discussed RICE. No open wounds, specifically on 1st digit.  Final Clinical Impression(s) / ED Diagnoses Final diagnoses:  Contusion of left hand, initial encounter  Injury of left hand, initial encounter    Rx / DC Orders ED Discharge Orders     None         Dolphus Jenny, Harley [place], PA 01/06/22 2011    Eber Hong, MD 01/16/22 2144

## 2022-01-06 NOTE — ED Triage Notes (Signed)
Pt to er, pt states that he smashed his hand.  Pt states that about 330 today he got his hand smashed between a buggy and a conveyor belt, states that he has some swelling and his hand hurts.

## 2022-01-13 ENCOUNTER — Other Ambulatory Visit: Payer: Self-pay

## 2022-01-13 ENCOUNTER — Emergency Department (HOSPITAL_COMMUNITY)
Admission: EM | Admit: 2022-01-13 | Discharge: 2022-01-13 | Disposition: A | Discharge status: Home / Self Care | Attending: Emergency Medicine | Admitting: Emergency Medicine

## 2022-01-13 ENCOUNTER — Encounter (HOSPITAL_COMMUNITY): Payer: Self-pay | Admitting: *Deleted

## 2022-01-13 ENCOUNTER — Emergency Department (HOSPITAL_COMMUNITY): Payer: Self-pay

## 2022-01-13 DIAGNOSIS — M25532 Pain in left wrist: Secondary | ICD-10-CM | POA: Diagnosis not present

## 2022-01-13 DIAGNOSIS — I10 Essential (primary) hypertension: Secondary | ICD-10-CM | POA: Insufficient documentation

## 2022-01-13 MED ORDER — TRAMADOL HCL 50 MG PO TABS
50.0000 mg | ORAL_TABLET | Freq: Once | ORAL | Status: AC
  Administered 2022-01-13: 50 mg via ORAL
  Filled 2022-01-13: qty 1

## 2022-01-13 MED ORDER — TRAMADOL HCL 50 MG PO TABS: 50.0000 mg | ORAL_TABLET | Freq: Four times a day (QID) | ORAL | 0 refills | Status: DC | PRN

## 2022-01-13 NOTE — ED Provider Notes (Signed)
[facility] EMERGENCY DEPARTMENT Provider Note   CSN: [id number] Arrival date & time: 01/13/22  1884     History  Chief Complaint  Patient presents with   Arm Injury    Darrell Parker is a 66 y.o. male.   Arm Injury Associated symptoms: no fever        Darrell Parker is a 66 y.o. male with past medical history of hypertension who presents to the Emergency Department requesting reevaluation of left wrist and forearm pain.  He was seen here a week ago for work-related injury in which his left wrist and hand was struck by a piece of equipment.  He noted swelling within several minutes that involved his left hand, wrist, and distal forearm.  He was seen here at onset of his symptoms and had x-rays of his wrist and forearm without evidence of fracture.  He continues to have pain to this area with swelling to the level of the mid forearm.  Pain is worsened with movement of his third and fourth fingers.  He denies any difficulty with finger movement or numbness of his hand or fingers.  No open wound   Home Medications Prior to Admission medications   Medication Sig Start Date End Date Taking? Authorizing Provider  traMADol (ULTRAM) 50 MG tablet Take 1 tablet (50 mg total) by mouth every 6 (six) hours as needed. 01/13/22  Yes Lucienne Sawyers, PA-C  predniSONE (DELTASONE) 20 MG tablet Take 60mg  x 2 days, 40mg  x 2 days, 20mg  x 3 days 03/16/18   , MD  sildenafil (VIAGRA) 50 MG tablet Take 1 tablet (50 mg total) by mouth daily as needed for erectile dysfunction. 03/16/18   03/18/18, MD      Allergies    Motrin [ibuprofen] and Tylenol [acetaminophen]    Review of Systems   Review of Systems  Constitutional:  Negative for chills and fever.  Musculoskeletal:  Positive for arthralgias (Left wrist pain and swelling).  Skin:  Negative for color change, rash and wound.  Neurological:  Negative for dizziness, weakness, numbness and headaches.    Physical Exam Updated Vital  Signs BP (!) 148/81 (BP Location: Right Arm)   Pulse 73   Temp 98.1 F (36.7 C) (Oral)   Resp 16   Ht 5\' 8"  (1.727 m)   Wt 97.5 kg   SpO2 99%   BMI 32.69 kg/m  Physical Exam Vitals and nursing note reviewed.  Constitutional:      Appearance: Normal appearance.  Cardiovascular:     Rate and Rhythm: Normal rate and regular rhythm.     Pulses: Normal pulses.  Pulmonary:     Effort: Pulmonary effort is normal.  Musculoskeletal:        General: Swelling, tenderness and signs of injury present. No deformity.     Left wrist: Swelling, tenderness and crepitus present. No lacerations or snuff box tenderness. Normal range of motion. Normal pulse.     Comments: Tenderness on range of motion of left wrist.  There is some crepitus noted at the proximal wrist.  Mild to moderate edema noted to the wrist and to the level of the distal forearm.  No bony deformity.  Snuffbox is nontender.  Compartments are soft.  Skin:    General: Skin is warm.     Capillary Refill: Capillary refill takes less than 2 seconds.     Findings: No bruising or erythema.  Neurological:     Mental Status: He is alert.  ED Results / Procedures / Treatments   Labs (all labs ordered are listed, but only abnormal results are displayed) Labs Reviewed - No data to display  EKG None  Radiology CT Wrist Left Wo Contrast  Result Date: 01/13/2022 CLINICAL DATA:  Wrist pain; occult fracture suspected EXAM: CT OF THE LEFT WRIST WITHOUT CONTRAST TECHNIQUE: Multidetector CT imaging was performed according to the standard protocol. Multiplanar CT image reconstructions were also generated. RADIATION DOSE REDUCTION: This exam was performed according to the departmental dose-optimization program which includes automated exposure control, adjustment of the mA and/or kV according to patient size and/or use of iterative reconstruction technique. COMPARISON:  Radiographs 01/06/2022 FINDINGS: Bones/Joint/Cartilage No acute fracture or  dislocation in the left wrist. Mild degenerative arthritis about the radiocarpal joint and first CMC/STT joints. Ligaments Suboptimally assessed by CT. Muscles and Tendons Unremarkable. Soft tissues Unremarkable. IMPRESSION: No acute fracture or dislocation in the left wrist. Electronically Signed   By: Minerva Fester M.D.   On: 01/13/2022 21:42    Procedures Procedures    Medications Ordered in ED Medications  traMADol (ULTRAM) tablet 50 mg (50 mg Oral Given 01/13/22 2236)    ED Course/ Medical Decision Making/ A&P                           Medical Decision Making Patient here for evaluation of continued pain and swelling of his left wrist after blunt trauma 1 week ago.  This is a work-related injury.  On exam, there is mild to moderate edema noted to the wrist and distal left forearm.  Some mild crepitus noted on range of motion of the wrist.  Compartments are soft.  Neurovascularly intact.  He has normal finger thumb opposition.  Good cap refill.  Diagnosis would include musculoskeletal injury occult fracture, compartment syndrome, cellulitis, ligamentous injury.  Extremity is neurovascularly intact and his compartments are soft.  There is no open wounds or erythema to suggest compartment syndrome or cellulitis.  X-rays from his previous visit were reviewed by me, no evidence of acute bony injury.  This may represent occult fracture.  Will obtain CT imaging  Amount and/or Complexity of Data Reviewed Radiology: ordered.    Details: CT of the wrist without acute fracture or dislocation.  Soft tissues were unremarkable. Discussion of management or test interpretation with external provider(s): I suspect injuries are related to sprain/strain of the wrist.  On further history, patient does admit to returning to work and continued use of the left wrist.   He will be placed in a wrist splint, I have recommended RICE therapy and close orthopedic follow-up.  He is agreeable to plan.  Appears  appropriate for discharge home.  Risk Prescription drug management.           Final Clinical Impression(s) / ED Diagnoses Final diagnoses:  Left wrist pain    Rx / DC Orders ED Discharge Orders          Ordered    traMADol (ULTRAM) 50 MG tablet  Every 6 hours PRN        01/13/22 2231              Pauline Aus, PA-C 01/13/22 2338    Gloris Manchester, MD 01/14/22 0124

## 2022-01-13 NOTE — Discharge Instructions (Signed)
Elevate your left arm is much as possible.  Wear the splint for support during the day.  You may remove for bathing at bedtime.  Call the orthopedic provider listed to arrange a follow-up appointment

## 2022-01-13 NOTE — ED Triage Notes (Signed)
Pt states left forearm pain with touch and moving the middle and ring finger of left hand.  Pt seen last week for the left hand injury. Had xray of hand and forearm done.

## 2022-01-22 ENCOUNTER — Encounter: Payer: Self-pay | Admitting: Radiology

## 2022-01-22 NOTE — Progress Notes (Signed)
Patient wants appt to be seen for left hand injury.  I have spoken with son Rosaria Ferries [phone] and told him we could check on patient being seen here, vs refer to hand.  Dr Aline Brochure can see patient.  I called Pablo again today, NA, cannot leave VM.  Need to call again to set up appt.  Will scan in the [place] into patient's appt desk.

## 2022-01-27 ENCOUNTER — Ambulatory Visit: Payer: Worker's Compensation | Admitting: Orthopedic Surgery

## 2022-02-05 ENCOUNTER — Emergency Department (HOSPITAL_COMMUNITY)

## 2022-02-05 ENCOUNTER — Encounter (HOSPITAL_COMMUNITY): Payer: Self-pay

## 2022-02-05 ENCOUNTER — Emergency Department (HOSPITAL_COMMUNITY)
Admission: EM | Admit: 2022-02-05 | Discharge: 2022-02-05 | Disposition: A | Discharge status: Home / Self Care | Attending: Emergency Medicine | Admitting: Emergency Medicine

## 2022-02-05 ENCOUNTER — Other Ambulatory Visit: Payer: Self-pay

## 2022-02-05 DIAGNOSIS — M25532 Pain in left wrist: Secondary | ICD-10-CM | POA: Insufficient documentation

## 2022-02-05 DIAGNOSIS — Y990 Civilian activity done for income or pay: Secondary | ICD-10-CM | POA: Diagnosis not present

## 2022-02-05 DIAGNOSIS — X509XXA Other and unspecified overexertion or strenuous movements or postures, initial encounter: Secondary | ICD-10-CM | POA: Insufficient documentation

## 2022-02-05 DIAGNOSIS — F17210 Nicotine dependence, cigarettes, uncomplicated: Secondary | ICD-10-CM | POA: Diagnosis not present

## 2022-02-05 MED ORDER — OXYCODONE HCL 5 MG PO TABS
10.0000 mg | ORAL_TABLET | Freq: Once | ORAL | Status: AC
  Administered 2022-02-05: 10 mg via ORAL
  Filled 2022-02-05: qty 2

## 2022-02-05 NOTE — Discharge Instructions (Signed)
You were evaluated in the Emergency Department and after careful evaluation, we did not find any emergent condition requiring admission or further testing in the hospital.  Your exam/testing today was overall reassuring.  Keep your appointment with your bone expert to discuss your symptoms.  Please return to the Emergency Department if you experience any worsening of your condition.  Thank you for allowing [place] to be a part of your care.

## 2022-02-05 NOTE — ED Triage Notes (Signed)
Pt has wrist injury at work a few weeks ago and tonight pt was at work and pushed some boxes and heard a "pop" and now is complaining of pain and swelling.

## 2022-02-05 NOTE — ED Provider Notes (Signed)
[facility] Emergency Department Provider Note MRN:  [id number]  Arrival date & time: 02/05/22     Chief Complaint   Wrist Injury   History of Present Illness   Darrell Parker is a 66 y.o. year-old male with no pertinent past medical history presenting to the ED with chief complaint of wrist injury.  Patient struck his left hand/wrist while at work on some moving machinery 2 weeks ago, having lingering pain.  Has been more tolerable in recent days, he ices it after work every day, pain adequately controlled with Tylenol.  He was pushing some boxes today and felt a pop in the wrist, pain is much worse at this time.  No other injuries or complaints.  Review of Systems  A thorough review of systems was obtained and all systems are negative except as noted in the HPI and PMH.   Patient's Health History    Past Medical History:  Diagnosis Date   Hearing deficit    Pneumonia     Past Surgical History:  Procedure Laterality Date   KNEE SURGERY      Family History  Problem Relation Age of Onset   Kidney failure Mother    Heart attack Mother     Social History   Socioeconomic History   Marital status: Single    Spouse name: Not on file   Number of children: Not on file   Years of education: Not on file   Highest education level: Not on file  Occupational History   Not on file  Tobacco Use   Smoking status: Every Day    Packs/day: 0.50    Types: Cigarettes   Smokeless tobacco: Never  Substance and Sexual Activity   Alcohol use: No   Drug use: No   Sexual activity: Not on file  Other Topics Concern   Not on file  Social History Narrative   Not on file   Social Determinants of Health   Financial Resource Strain: Not on file  Food Insecurity: Not on file  Transportation Needs: Not on file  Physical Activity: Not on file  Stress: Not on file  Social Connections: Not on file  Intimate Partner Violence: Not on file     Physical Exam    Vitals:   02/05/22 0615  BP: (!) 160/89  Pulse: 68  Resp: 15  Temp: 97.9 F (36.6 C)  SpO2: 100%    CONSTITUTIONAL: Well-appearing, NAD NEURO/PSYCH:  Alert and oriented x 3, no focal deficits EYES:  eyes equal and reactive ENT/NECK:  no LAD, no JVD CARDIO: Regular rate, well-perfused, normal S1 and S2 PULM:  CTAB no wheezing or rhonchi GI/GU:  non-distended, non-tender MSK/SPINE:  No gross deformities, no edema SKIN:  no rash, atraumatic   *Additional and/or pertinent findings included in MDM below  Diagnostic and Interventional Summary    EKG Interpretation  Date/Time:    Ventricular Rate:    PR Interval:    QRS Duration:   QT Interval:    QTC Calculation:   R Axis:     Text Interpretation:         Labs Reviewed - No data to display  DG Wrist Complete Left  Final Result      Medications  oxyCODONE (Oxy IR/ROXICODONE) immediate release tablet 10 mg (10 mg Oral Given 02/05/22 [id number])     Procedures  /  Critical Care Procedures  ED Course and Medical Decision Making  Initial Impression and Ddx Tender to the dorsum of  the wrist, normal range of motion of the joints, no significant erythema or increased warmth, doubt infection.  Suspect reaggravation of injury from pushing boxes today, given that he felt a pop will obtain repeat x-ray.  He had reassuring CT imaging few weeks ago.  He has follow-up with the orthopedic specialist later today.  Past medical/surgical history that increases complexity of ED encounter: None  Interpretation of Diagnostics I personally reviewed the chest x-ray and my interpretation is as follows: No obvious fracture    Patient Reassessment and Ultimate Disposition/Management     Discharge.  Patient management required discussion with the following services or consulting groups:  None  Complexity of Problems Addressed Acute complicated illness or Injury  Additional Data Reviewed and Analyzed Further history obtained  from: None  Additional Factors Impacting ED Encounter Risk None  Barth Kirks. Sedonia Small, MD [place] [email or web address]  Final Clinical Impressions(s) / ED Diagnoses     ICD-10-CM   1. Left wrist pain  M25.532       ED Discharge Orders     None        Discharge Instructions Discussed with and Provided to Patient:    Discharge Instructions      You were evaluated in the Emergency Department and after careful evaluation, we did not find any emergent condition requiring admission or further testing in the hospital.  Your exam/testing today was overall reassuring.  Keep your appointment with your bone expert to discuss your symptoms.  Please return to the Emergency Department if you experience any worsening of your condition.  Thank you for allowing [place] to be a part of your care.       Maudie Flakes, MD 02/05/22 [phone]

## 2022-02-10 ENCOUNTER — Encounter: Payer: Self-pay | Admitting: Orthopedic Surgery

## 2022-02-10 ENCOUNTER — Ambulatory Visit (INDEPENDENT_AMBULATORY_CARE_PROVIDER_SITE_OTHER): Admitting: Orthopedic Surgery

## 2022-02-10 VITALS — BP 170/103 | HR 86 | Wt 204.0 lb

## 2022-02-10 DIAGNOSIS — S60212A Contusion of left wrist, initial encounter: Secondary | ICD-10-CM | POA: Diagnosis not present

## 2022-02-10 DIAGNOSIS — T148XXA Other injury of unspecified body region, initial encounter: Secondary | ICD-10-CM

## 2022-02-10 MED ORDER — NAPROXEN 500 MG PO TABS: 500.0000 mg | ORAL_TABLET | Freq: Two times a day (BID) | ORAL | 2 refills | Status: DC

## 2022-02-10 MED ORDER — TRAMADOL HCL 50 MG PO TABS: 50.0000 mg | ORAL_TABLET | Freq: Four times a day (QID) | ORAL | 0 refills | Status: AC | PRN

## 2022-02-10 NOTE — Progress Notes (Signed)
ER FOLLOW UP   Chief Complaint  Patient presents with   New Patient (Initial Visit)    LT hand injury/ WC DOI 01/06/22    66 year old male was injured at work.  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  He has had immediate pain and swelling on the dorsum of the wrist has been to the emergency room on 2 occasions imaging included plain films and CT scan there was no fracture  The patient comes in complaining of inability to extend his long and ring finger increased pain with wrist extension and loss of sensation of the thumb long and ring finger  System review is negative  Past Medical History:  Diagnosis Date   Hearing deficit    Pneumonia    Past Surgical History:  Procedure Laterality Date   KNEE SURGERY     BP (!) 170/103   Pulse 86   Wt 204 lb (92.5 kg)   BMI 31.02 kg/m   General normal appearance healthy no deformities  Cardiovascular normal pulse and perfusion of the hand normal color and capillary refill  Neurologically patient has decreased sensation in the long and ring finger dorsal and volar remaining fingers normal sensation except dorsum of the thumb  Motor exam the patient has decreased extension of the long finger and ring finger normal index normal small normal thumb  Flexion of the wrist is normal but extension is painful  There is dorsal swelling over the wrist with extreme tenderness  Elbow flexion extension normal  Skin intact with no lacerations  X-ray imaging first image multiple views of the left wrist  I read the image.  I do not see a fracture or dislocation  Image second image multiple views CT scan  I read the CT scan I do not see a fracture    Encounter Diagnoses  Name Primary?   Contusion of left wrist, initial encounter Yes   Peripheral nerve contusion     Recommend the patient get a neutral wrist splint and start occupational therapy to regain range of  motion of his wrist and hand  I took him out of work for 4 weeks no light duty no return to work no exceptions  His prognosis is guarded this may take a long time to recover function  Meds ordered this encounter  Medications   traMADol (ULTRAM) 50 MG tablet    Sig: Take 1 tablet (50 mg total) by mouth every 6 (six) hours as needed for up to 7 days.    Dispense:  28 tablet    Refill:  0   naproxen (NAPROSYN) 500 MG tablet    Sig: Take 1 tablet (500 mg total) by mouth 2 (two) times daily with a meal.    Dispense:  60 tablet    Refill:  2

## 2022-02-10 NOTE — Patient Instructions (Addendum)
OOW 6 WEEKS   NO LIGHT DUTY  PAIN CONTROL:  TAKE TYLENOL 500 MG EVERY 6 HRS  TAKE Naproxen 500MG  EVERY 8 HRS   TAKE TRAMADOL 50 MG EVERY 6 HRS FOR PAIN   Splint has been ordered for you at [facility] occupational therapy . They should call you to schedule, [phone] is the phone number to call, if you want to call to schedule.

## 2022-02-15 ENCOUNTER — Telehealth: Payer: Self-pay | Admitting: Orthopedic Surgery

## 2022-02-15 NOTE — Telephone Encounter (Signed)
Call from workers comp, nurse case manager, [place], [place] [phone]/Fax [phone].  Verified claim #[id number], as previously received, for date 01/06/22.  States adjuster will be changing; therefore, will call back with this information. States uses 3rd party [facility], for therapy; orders faxed, w/notes; therapy to be scheduled.

## 2022-02-26 ENCOUNTER — Encounter (HOSPITAL_COMMUNITY): Payer: Self-pay | Admitting: Occupational Therapy

## 2022-02-26 ENCOUNTER — Ambulatory Visit (HOSPITAL_COMMUNITY): Payer: Self-pay | Attending: Orthopedic Surgery | Admitting: Occupational Therapy

## 2022-02-26 DIAGNOSIS — R29898 Other symptoms and signs involving the musculoskeletal system: Secondary | ICD-10-CM | POA: Insufficient documentation

## 2022-02-26 DIAGNOSIS — R600 Localized edema: Secondary | ICD-10-CM | POA: Insufficient documentation

## 2022-02-26 DIAGNOSIS — M25532 Pain in left wrist: Secondary | ICD-10-CM | POA: Insufficient documentation

## 2022-02-26 NOTE — Therapy (Signed)
OUTPATIENT OCCUPATIONAL THERAPY ORTHO EVALUATION // //Patient Name: Darrell Parker MRN: [id number] DOB:04-Nov-1955, 66 y.o., male Today's Date: 02/26/2022  PCP: None REFERRING PROVIDER: Dr. Fuller [place]   OT End of Session - 02/26/22 1145     Visit Number 1    Number of Visits 8    Date for OT Re-Evaluation 03/28/22    Authorization Type workers comp    Authorization Time Period waiting on paperwork;    OT Start Time 1025    OT Stop Time 1120    OT Time Calculation (min) 55 min    Activity Tolerance Patient tolerated treatment well    Behavior During Therapy [facility] for tasks assessed/performed             Past Medical History:  Diagnosis Date   Hearing deficit    Pneumonia    Past Surgical History:  Procedure Laterality Date   KNEE SURGERY     Patient Active Problem List   Diagnosis Date Noted   TIA (transient ischemic attack) 03/15/2018   Tobacco use 03/15/2018   HTN (hypertension) 03/15/2018   Microscopic hematuria 03/15/2018   Insomnia 03/15/2018   RUE numbness 03/15/2018    ONSET DATE: 01/06/22  REFERRING DIAG: Contusion of left wrist, peripheral nerve contusion  THERAPY DIAG:  Pain in left wrist  Localized edema  Other symptoms and signs involving the musculoskeletal system  Rationale for Evaluation and Treatment: Rehabilitation  SUBJECTIVE:   SUBJECTIVE STATEMENT: S: I'm ready to go back to work, I'm getting bored at home.  Pt accompanied by: self  PERTINENT HISTORY: Pt is a 66 y/o male who was injured at work on 01/06/22 when an item on a conveyor belt hit his left dorsal wrist. Pt reports it felt like an ant bite but a few minutes later his wrist had swollen and was painful and bruising. Imaging was negative for a fracture, pt reports he has had continued pain and sensation issues since the injury, does have intermittent swelling. Pt referred to occupational therapy for a splint and to begin ROM.   PRECAUTIONS: None  WEIGHT BEARING RESTRICTIONS:  No  PAIN:  Are you having pain? Yes: NPRS scale: 8/10 Pain location: left dorsal wrist Pain description: aching, throbbing Aggravating factors: movement Relieving factors: rest  FALLS: Has patient fallen in last 6 months? No  LIVING ENVIRONMENT: Lives with: lives with their son and 4 grandkids Lives in: House/apartment Stairs: No Has following equipment at home: None  PLOF: Independent  PATIENT GOALS: To be able to use his left hand and go back to work.   OBJECTIVE:   HAND DOMINANCE: Left  ADLs: Overall ADLs: Pt is having difficulty using dominant LUE during ADLs. Pt is unable to lift pots and pans for cooking, has difficulty lifting cups to take a drink. Opening jars and containers is difficulty. Unable to complete buttons, zippers, ties with left hand. Unable to use the can opener.    FUNCTIONAL OUTCOME MEASURES: Quick Dash: 81.82  UPPER EXTREMITY ROM:     Active ROM Left eval  Wrist flexion 38  Wrist extension 15  Wrist ulnar deviation 32  Wrist radial deviation 20  Wrist pronation 90  Wrist supination 90  (Blank rows = not tested)    *Pt able to flex and extend digits into a full fist.    UPPER EXTREMITY MMT:     MMT Left eval  Wrist flexion 3/5  Wrist extension 3/5  Wrist ulnar deviation 3/5  Wrist radial deviation 3/5  Wrist  pronation 3/5  Wrist supination 3//5  (Blank rows = not tested)  HAND FUNCTION: Grip strength: Right: 75 lbs; Left: 58 lbs, Lateral pinch: Right: 22 lbs, Left: 11 lbs, and 3 point pinch: Right: 16 lbs, Left: 8 lbs *Red dynamometer  COORDINATION: Not tested. Will test at a later date if needed.   SENSATION: Pt reports numbness along dorsal surface medial nerve distribution. Otherwise LUE sensation is intact   EDEMA: Mild swelling throughout L wrist   R Wrist: 17.5cm  L Wrist: 18cm  R/L MCP: 22cm    COGNITION: Overall cognitive status: Within functional limits for tasks assessed   OBSERVATIONS: Noted decreased ROM  when attempting to extend 3rd and 4th digit with hand flat on table. Pt able to make full fist and extend finger out of fist fully.    TODAY'S TREATMENT:                                                                                                                              DATE:  02/26/2022:  -Splint fabrication: fabricated volar wrist cock-up splint with wrist in neutral. Strapping at mid-forearm and across dorsal phalanges.   -Provided size small edema glove   PATIENT EDUCATION: Education details: Pt educated on POC, goals, use of edema glove (size small), and splint care  Person educated: Patient Education method: Explanation Education comprehension: verbalized understanding  HOME EXERCISE PROGRAM:  02/26/22-Wrist A/ROM  GOALS: Goals reviewed with patient? Yes  SHORT TERM GOALS: Target date: 03/26/2022    Pt will be provided with and educated on HEP to improve ability to use LUE as dominant during ADLs.   Goal status: INITIAL  2.  Pt will increase left wrist A/ROM to [facility] to improve ability to perform functional reaching during dressing and bathing tasks using LUE.   Goal status: INITIAL  3.  Pt will increase left wrist strength to 4+/5 or greater to improve ability to lift pots and pans during cooking tasks.   Goal status: INITIAL  4.  Pt will increase left grip strength by 15# to improve ability to perform gripping and lifting tasks using left hand as dominant.   Goal status: INITIAL  5.  Pt will be educated on splint wear and care to improve ability to use LUE as assist during ADLs while using splint for protection and support.   Goal status: INITIAL  6.  Pt will decrease pain in left wrist to improve ability to sleep for 3+ hours without waking due to pain.    Goal status: INITIAL   ASSESSMENT:  CLINICAL IMPRESSION: Patient is a 66 y.o. male who was seen today for occupational therapy evaluation s/p left hand injury at work on 01/06/22. Pt presents with  left wrist in a sugar tong splint, neutral volar wrist cock-up splint fabricated today for protection and support. Pt demonstrating limited ROM and strength due to pain, is able to extend 3rd and 4th digits minimally. Pt reporting sensation  deficits along the median nerve as well. Educated on splint wear and care today, and began wrist A/ROM HEP.   PERFORMANCE DEFICITS: in functional skills including ADLs, IADLs, coordination, dexterity, sensation, edema, ROM, strength, pain, fascial restrictions, and UE functional use  IMPAIRMENTS: are limiting patient from ADLs, IADLs, rest and sleep, work, and play.   COMORBIDITIES: has no other co-morbidities that affects occupational performance. Patient will benefit from skilled OT to address above impairments and improve overall function.  MODIFICATION OR ASSISTANCE TO COMPLETE EVALUATION: No modification of tasks or assist necessary to complete an evaluation.  OT OCCUPATIONAL PROFILE AND HISTORY: Problem focused assessment: Including review of records relating to presenting problem.  CLINICAL DECISION MAKING: LOW - limited treatment options, no task modification necessary  REHAB POTENTIAL: Good  EVALUATION COMPLEXITY: Low      PLAN:  OT FREQUENCY: 2x/week  OT DURATION: 4 weeks  PLANNED INTERVENTIONS: self care/ADL training, therapeutic exercise, therapeutic activity, manual therapy, passive range of motion, splinting, electrical stimulation, ultrasound, compression bandaging, patient/family education, and DME and/or AE instructions  CONSULTED AND AGREED WITH PLAN OF CARE: Patient  PLAN FOR NEXT SESSION: Follow up on splint wear and HEP, continue with wrist A/ROM, adjust splint as needed, began hand and digit A/ROM and strengthening   Ezra Sites, OTR/L  [phone] 02/26/2022, 11:47 AM

## 2022-02-26 NOTE — Patient Instructions (Addendum)
AROM Exercises   1) Wrist Flexion  Start with wrist at edge of table, palm facing up. With wrist hanging slightly off table, curl wrist upward, and back down.      2) Wrist Extension  Start with wrist at edge of table, palm facing down. With wrist slightly off the edge of the table, curl wrist up and back down.      3) Radial Deviations  Start with forearm flat against a table, wrist hanging slightly off the edge, and palm facing the wall. Bending at the wrist only, and keeping palm facing the wall, bend wrist so fist is pointing towards the floor, back up to start position, and up towards the ceiling. Return to start.        4) WRIST PRONATION  Turn your forearm towards palm face down.  Keep your elbow bent and by the side of your  Body.      5) WRIST SUPINATION  Turn your forearm towards palm face up.  Keep your elbow bent and by the side of your  Body.      *Complete exercises __10____ times each, ___2-3____ times per day*      

## 2022-03-02 ENCOUNTER — Ambulatory Visit (HOSPITAL_COMMUNITY): Payer: Self-pay | Admitting: Occupational Therapy

## 2022-03-02 DIAGNOSIS — R29898 Other symptoms and signs involving the musculoskeletal system: Secondary | ICD-10-CM

## 2022-03-02 DIAGNOSIS — M25532 Pain in left wrist: Secondary | ICD-10-CM

## 2022-03-02 DIAGNOSIS — R600 Localized edema: Secondary | ICD-10-CM

## 2022-03-02 NOTE — Therapy (Unsigned)
OUTPATIENT OCCUPATIONAL THERAPY ORTHO EVALUATION  Patient Name: Darrell Parker MRN: [id number] DOB:July 25, 1955, 66 y.o., male Today's Date: 03/03/2022  PCP: None REFERRING PROVIDER: Dr. Fuller Canada   OT End of Session - 03/02/22 1115     Visit Number 2    Number of Visits 9    Date for OT Re-Evaluation 03/28/22    Authorization Type workers comp    Authorization Time Period waiting on paperwork;    OT Start Time 1120    OT Stop Time 1205    OT Time Calculation (min) 45 min    Activity Tolerance Patient tolerated treatment well    Behavior During Therapy [facility] for tasks assessed/performed             Past Medical History:  Diagnosis Date   Hearing deficit    Pneumonia    Past Surgical History:  Procedure Laterality Date   KNEE SURGERY     Patient Active Problem List   Diagnosis Date Noted   TIA (transient ischemic attack) 03/15/2018   Tobacco use 03/15/2018   HTN (hypertension) 03/15/2018   Microscopic hematuria 03/15/2018   Insomnia 03/15/2018   RUE numbness 03/15/2018    ONSET DATE: 01/06/22  REFERRING DIAG: Contusion of left wrist, peripheral nerve contusion  THERAPY DIAG:  Pain in left wrist  Other symptoms and signs involving the musculoskeletal system  Localized edema  Rationale for Evaluation and Treatment: Rehabilitation  SUBJECTIVE:   SUBJECTIVE STATEMENT: S: I'm ready to go back to work, I'm getting bored at home.   PRECAUTIONS: None  WEIGHT BEARING RESTRICTIONS: No  PAIN:  Are you having pain? Yes: NPRS scale: 8/10 Pain location: left dorsal wrist Pain description: aching, throbbing Aggravating factors: movement Relieving factors: rest  PATIENT GOALS: To be able to use his left hand and go back to work.   OBJECTIVE:   HAND DOMINANCE: Left  ADLs: Overall ADLs: Pt is having difficulty using dominant LUE during ADLs. Pt is unable to lift pots and pans for cooking, has difficulty lifting cups to take a drink. Opening jars and  containers is difficulty. Unable to complete buttons, zippers, ties with left hand. Unable to use the can opener.    FUNCTIONAL OUTCOME MEASURES: Quick Dash: 81.82  UPPER EXTREMITY ROM:     Active ROM Left eval  Wrist flexion 38  Wrist extension 15  Wrist ulnar deviation 32  Wrist radial deviation 20  Wrist pronation 90  Wrist supination 90  (Blank rows = not tested)    *Pt able to flex and extend digits into a full fist.    UPPER EXTREMITY MMT:     MMT Left eval  Wrist flexion 3/5  Wrist extension 3/5  Wrist ulnar deviation 3/5  Wrist radial deviation 3/5  Wrist pronation 3/5  Wrist supination 3//5  (Blank rows = not tested)  HAND FUNCTION: Grip strength: Right: 75 lbs; Left: 58 lbs, Lateral pinch: Right: 22 lbs, Left: 11 lbs, and 3 point pinch: Right: 16 lbs, Left: 8 lbs *Red dynamometer  COORDINATION: Not tested. Will test at a later date if needed.   SENSATION: Pt reports numbness along dorsal surface medial nerve distribution. Otherwise LUE sensation is intact   EDEMA: Mild swelling throughout L wrist   R Wrist: 17.5cm  L Wrist: 18cm  R/L MCP: 22cm    COGNITION: Overall cognitive status: Within functional limits for tasks assessed   OBSERVATIONS: Noted decreased ROM when attempting to extend 3rd and 4th digit with hand flat on table. Pt  able to make full fist and extend finger out of fist fully.    TODAY'S TREATMENT:                                                                                                                              DATE:  03/02/22 -manual therapy: myofascial release and trigger point applied to the L forearm, wrist, and hand in order to reduce fascial restrictions and pain, to improve ROM for ADL's.  -P/ROM: Wrist extension, wrist flexion, ulnar deviation, radial deviation, supination, pronation, 1x10 -A/ROM: Wrist extension, wrist flexion, ulnar deviation, radial deviation, supination, pronation, 1x10 -Theraputty: red  theraputty, roll into a ball, flatten into a pancake, roll into a log, lateral pinch, roll into a log, tripod pinches, roll into a ball, squeeze ball x10  02/26/2022: -Splint fabrication: fabricated volar wrist cock-up splint with wrist in neutral. Strapping at mid-forearm and across dorsal phalanges.  -Provided size small edema glove   PATIENT EDUCATION: Education details: theraputty Person educated: Patient Education method: Explanation Education comprehension: verbalized understanding  HOME EXERCISE PROGRAM: 02/26/22:Wrist A/ROM 03/02/22: Theraputty  GOALS: Goals reviewed with patient? Yes  SHORT TERM GOALS: Target date: 03/31/2022    Pt will be provided with and educated on HEP to improve ability to use LUE as dominant during ADLs.   Goal status: IN PROGRESS  2.  Pt will increase left wrist A/ROM to [facility] to improve ability to perform functional reaching during dressing and bathing tasks using LUE.   Goal status: IN PROGRESS  3.  Pt will increase left wrist strength to 4+/5 or greater to improve ability to lift pots and pans during cooking tasks.   Goal status: IN PROGRESS  4.  Pt will increase left grip strength by 15# to improve ability to perform gripping and lifting tasks using left hand as dominant.   Goal status: IN PROGRESS  5.  Pt will be educated on splint wear and care to improve ability to use LUE as assist during ADLs while using splint for protection and support.   Goal status: IN PROGRESS  6.  Pt will decrease pain in left wrist to improve ability to sleep for 3+ hours without waking due to pain.    Goal status: IN PROGRESS   ASSESSMENT:  CLINICAL IMPRESSION: Pt presenting to therapy with continued pain and swelling on the dorsal aspect of his wrist. Therapist completed manual therapy and P/ROM, however pt was having increased pain in his wrist with P/ROM with wrist extension and flexion. Pt then completing active ROM and theraputty exercises, which  cause muscle fatigue quickly and he required multiple breaks. Therapist provided verbal and tactile cuing for technique and proper mechanics throughout all active tasks. Additionally pt reporting inconsistencies with splint wearing, therapist providing education on importance of the splint and wearing at all times for support and protection at this time.    PLAN:  OT FREQUENCY: 2x/week  OT DURATION: 4 weeks  PLANNED INTERVENTIONS:  self care/ADL training, therapeutic exercise, therapeutic activity, manual therapy, passive range of motion, splinting, electrical stimulation, ultrasound, compression bandaging, patient/family education, and DME and/or AE instructions  CONSULTED AND AGREED WITH PLAN OF CARE: Patient  PLAN FOR NEXT SESSION: Follow up on splint wear and HEP, continue with wrist A/ROM, adjust splint as needed, begin hand and digit A/ROM and strengthening   Trish Mage, OTR/L [phone] 03/03/2022, 3:05 PM

## 2022-03-03 ENCOUNTER — Encounter (HOSPITAL_COMMUNITY): Payer: Self-pay | Admitting: Occupational Therapy

## 2022-03-04 ENCOUNTER — Encounter (HOSPITAL_COMMUNITY): Payer: Self-pay | Admitting: Occupational Therapy

## 2022-03-04 ENCOUNTER — Ambulatory Visit (HOSPITAL_COMMUNITY): Attending: Orthopedic Surgery | Admitting: Occupational Therapy

## 2022-03-04 DIAGNOSIS — R29898 Other symptoms and signs involving the musculoskeletal system: Secondary | ICD-10-CM | POA: Diagnosis present

## 2022-03-04 DIAGNOSIS — R600 Localized edema: Secondary | ICD-10-CM | POA: Diagnosis present

## 2022-03-04 DIAGNOSIS — M25532 Pain in left wrist: Secondary | ICD-10-CM | POA: Diagnosis present

## 2022-03-04 NOTE — Therapy (Signed)
OUTPATIENT OCCUPATIONAL THERAPY ORTHO TREATMENT NOTE  Patient Name: Darrell Parker MRN: [id number] DOB:June 13, 1955, 66 y.o., male Today's Date: 03/04/2022  PCP: None REFERRING PROVIDER: Dr. Fuller Canada  END OF SESSION:  OT End of Session - 03/04/22 1124     Visit Number 3    Number of Visits 9    Date for OT Re-Evaluation 03/28/22    Authorization Type workers comp    Authorization Time Period waiting on paperwork;    OT Start Time 0945    OT Stop Time 1030    OT Time Calculation (min) 45 min    Activity Tolerance Patient tolerated treatment well    Behavior During Therapy [facility] for tasks assessed/performed              Past Medical History:  Diagnosis Date   Hearing deficit    Pneumonia    Past Surgical History:  Procedure Laterality Date   KNEE SURGERY     Patient Active Problem List   Diagnosis Date Noted   TIA (transient ischemic attack) 03/15/2018   Tobacco use 03/15/2018   HTN (hypertension) 03/15/2018   Microscopic hematuria 03/15/2018   Insomnia 03/15/2018   RUE numbness 03/15/2018    ONSET DATE: 01/06/22  REFERRING DIAG: Contusion of left wrist, peripheral nerve contusion  THERAPY DIAG:  Pain in left wrist  Localized edema  Other symptoms and signs involving the musculoskeletal system  Rationale for Evaluation and Treatment: Rehabilitation  SUBJECTIVE:   SUBJECTIVE STATEMENT: S: My wrist keeps swelling and swelling and swelling whenever I try to do any of my exercises or move my hand.    PRECAUTIONS: None  WEIGHT BEARING RESTRICTIONS: No  PAIN:  Are you having pain? Yes: NPRS scale: 8/10 Pain location: left dorsal wrist Pain description: aching, throbbing Aggravating factors: movement Relieving factors: rest  PATIENT GOALS: To be able to use his left hand and go back to work.   OBJECTIVE:   HAND DOMINANCE: Left  ADLs: Overall ADLs: Pt is having difficulty using dominant LUE during ADLs. Pt is unable to lift pots and pans for  cooking, has difficulty lifting cups to take a drink. Opening jars and containers is difficulty. Unable to complete buttons, zippers, ties with left hand. Unable to use the can opener.    FUNCTIONAL OUTCOME MEASURES: Quick Dash: 81.82  UPPER EXTREMITY ROM:     Active ROM Left eval  Wrist flexion 38  Wrist extension 15  Wrist ulnar deviation 32  Wrist radial deviation 20  Wrist pronation 90  Wrist supination 90  (Blank rows = not tested)    *Pt able to flex and extend digits into a full fist.    UPPER EXTREMITY MMT:     MMT Left eval  Wrist flexion 3/5  Wrist extension 3/5  Wrist ulnar deviation 3/5  Wrist radial deviation 3/5  Wrist pronation 3/5  Wrist supination 3//5  (Blank rows = not tested)  HAND FUNCTION: Grip strength: Right: 75 lbs; Left: 58 lbs, Lateral pinch: Right: 22 lbs, Left: 11 lbs, and 3 point pinch: Right: 16 lbs, Left: 8 lbs *Red dynamometer  COORDINATION: Not tested. Will test at a later date if needed.   SENSATION: Pt reports numbness along dorsal surface medial nerve distribution. Otherwise LUE sensation is intact   EDEMA: Mild swelling throughout L wrist   R Wrist: 17.5cm  L Wrist: 18cm  R/L MCP: 22cm    COGNITION: Overall cognitive status: Within functional limits for tasks assessed   OBSERVATIONS: Noted decreased  ROM when attempting to extend 3rd and 4th digit with hand flat on table. Pt able to make full fist and extend finger out of fist fully.    TODAY'S TREATMENT:                                                                                                                              DATE:  03/04/22 -manual therapy: myofascial release and trigger point applied to the L forearm, wrist, and hand in order to reduce fascial restrictions and pain, to improve ROM for ADL's.  -P/ROM: Wrist extension, wrist flexion, ulnar deviation, radial deviation, supination, pronation, 1x10 -Tendon glide: digits and wrist extended, flex digits at  PIP and DIP, flex digits at MCP, PIP, and DIP, extend digit PIP and DIP keeping MCP flexed, extend all digits and wrist, 1x10 -Median nerve floss: shoulder abducted to 90 degrees with elbow and wrist extended with palm up, neck laterally flexed to the R shoulder, slowly extended wrist with digits extended, 1x10 -A/ROM: Wrist extension, wrist flexion, ulnar deviation, radial deviation, supination, pronation, 1x10  03/02/22 -manual therapy: myofascial release and trigger point applied to the L forearm, wrist, and hand in order to reduce fascial restrictions and pain, to improve ROM for ADL's.  -P/ROM: Wrist extension, wrist flexion, ulnar deviation, radial deviation, supination, pronation, 1x10 -A/ROM: Wrist extension, wrist flexion, ulnar deviation, radial deviation, supination, pronation, 1x10 -Theraputty: red theraputty, roll into a ball, flatten into a pancake, roll into a log, lateral pinch, roll into a log, tripod pinches, roll into a ball, squeeze ball x10  02/26/2022: -Splint fabrication: fabricated volar wrist cock-up splint with wrist in neutral. Strapping at mid-forearm and across dorsal phalanges.  -Provided size small edema glove   PATIENT EDUCATION: Education details: Median Nerve Floss Person educated: Patient Education method: Explanation Education comprehension: verbalized understanding  HOME EXERCISE PROGRAM: 02/26/22:Wrist A/ROM 03/02/22: Theraputty 03/03/22: Median Nerve Floss  GOALS: Goals reviewed with patient? Yes  SHORT TERM GOALS: Target date: 04/01/2022    Pt will be provided with and educated on HEP to improve ability to use LUE as dominant during ADLs.   Goal status: IN PROGRESS  2.  Pt will increase left wrist A/ROM to [facility] to improve ability to perform functional reaching during dressing and bathing tasks using LUE.   Goal status: IN PROGRESS  3.  Pt will increase left wrist strength to 4+/5 or greater to improve ability to lift pots and pans during  cooking tasks.   Goal status: IN PROGRESS  4.  Pt will increase left grip strength by 15# to improve ability to perform gripping and lifting tasks using left hand as dominant.   Goal status: IN PROGRESS  5.  Pt will be educated on splint wear and care to improve ability to use LUE as assist during ADLs while using splint for protection and support.   Goal status: IN PROGRESS  6.  Pt will decrease pain in left wrist to  improve ability to sleep for 3+ hours without waking due to pain.    Goal status: IN PROGRESS   ASSESSMENT:  CLINICAL IMPRESSION: Pt presenting to therapy with continued pain and swelling on the dorsal aspect of his wrist. He reports that with movement and exercises, edema increases and pain feels like a pinching sensation on the top of his wrist, with numbness worsening through D3 and D4. Therapist focused this session on nerve glides/flosses to assist with lessening pain, while continuing to encourage movement through the wrist. Passive and Active ROM with wrist flexing and radial deviation worsening the pain the most. Pt reports wearing splint all the time the past 2 days except when doing his exercises and showering. He continues to benefit from skilled OT to maximize ROM and strength, in order to improve ADL and IADL independence.     PLAN:  OT FREQUENCY: 2x/week  OT DURATION: 4 weeks  PLANNED INTERVENTIONS: self care/ADL training, therapeutic exercise, therapeutic activity, manual therapy, passive range of motion, splinting, electrical stimulation, ultrasound, compression bandaging, patient/family education, and DME and/or AE instructions  CONSULTED AND AGREED WITH PLAN OF CARE: Patient  PLAN FOR NEXT SESSION: Follow up on splint wear and HEP, continue with wrist A/ROM, adjust splint as needed, begin hand and digit A/ROM and strengthening   Trish Mage, OTR/L [phone] 03/04/2022, 11:26 AM

## 2022-03-09 ENCOUNTER — Ambulatory Visit (HOSPITAL_COMMUNITY): Admitting: Occupational Therapy

## 2022-03-09 DIAGNOSIS — M25532 Pain in left wrist: Secondary | ICD-10-CM

## 2022-03-09 DIAGNOSIS — R29898 Other symptoms and signs involving the musculoskeletal system: Secondary | ICD-10-CM

## 2022-03-09 DIAGNOSIS — R600 Localized edema: Secondary | ICD-10-CM

## 2022-03-09 NOTE — Therapy (Signed)
OUTPATIENT OCCUPATIONAL THERAPY ORTHO TREATMENT NOTE  Patient Name: Darrell Parker MRN: [id number] DOB:[birth year], 66 y.o., male Today's Date: 03/10/2022  PCP: None REFERRING PROVIDER: Dr. Fuller Canada  END OF SESSION:  OT End of Session - 03/09/22 1530     Visit Number 4    Number of Visits 9    Date for OT Re-Evaluation 03/28/22    Authorization Type workers comp    Authorization Time Period waiting on paperwork;    OT Start Time 1445    OT Stop Time 1530    OT Time Calculation (min) 45 min    Activity Tolerance Patient tolerated treatment well    Behavior During Therapy [facility] for tasks assessed/performed             Past Medical History:  Diagnosis Date   Hearing deficit    Pneumonia    Past Surgical History:  Procedure Laterality Date   KNEE SURGERY     Patient Active Problem List   Diagnosis Date Noted   TIA (transient ischemic attack) 03/15/2018   Tobacco use 03/15/2018   HTN (hypertension) 03/15/2018   Microscopic hematuria 03/15/2018   Insomnia 03/15/2018   RUE numbness 03/15/2018    ONSET DATE: 01/06/22  REFERRING DIAG: Contusion of left wrist, peripheral nerve contusion  THERAPY DIAG:  Pain in left wrist  Localized edema  Other symptoms and signs involving the musculoskeletal system  Rationale for Evaluation and Treatment: Rehabilitation  SUBJECTIVE:   SUBJECTIVE STATEMENT: S: I haven't been able to do much at all with my since I last saw you.  PRECAUTIONS: None  WEIGHT BEARING RESTRICTIONS: No  PAIN:  Are you having pain? Yes: NPRS scale: 6/10 Pain location: left dorsal wrist Pain description: aching, throbbing Aggravating factors: movement Relieving factors: rest  PATIENT GOALS: To be able to use his left hand and go back to work.   OBJECTIVE:   HAND DOMINANCE: Left  ADLs: Overall ADLs: Pt is having difficulty using dominant LUE during ADLs. Pt is unable to lift pots and pans for cooking, has difficulty lifting cups to take a  drink. Opening jars and containers is difficulty. Unable to complete buttons, zippers, ties with left hand. Unable to use the can opener.    FUNCTIONAL OUTCOME MEASURES: Quick Dash: 81.82  UPPER EXTREMITY ROM:     Active ROM Left eval  Wrist flexion 38  Wrist extension 15  Wrist ulnar deviation 32  Wrist radial deviation 20  Wrist pronation 90  Wrist supination 90  (Blank rows = not tested)    *Pt able to flex and extend digits into a full fist.    UPPER EXTREMITY MMT:     MMT Left eval  Wrist flexion 3/5  Wrist extension 3/5  Wrist ulnar deviation 3/5  Wrist radial deviation 3/5  Wrist pronation 3/5  Wrist supination 3//5  (Blank rows = not tested)  HAND FUNCTION: Grip strength: Right: 75 lbs; Left: 58 lbs, Lateral pinch: Right: 22 lbs, Left: 11 lbs, and 3 point pinch: Right: 16 lbs, Left: 8 lbs *Red dynamometer  COORDINATION: Not tested. Will test at a later date if needed.   SENSATION: Pt reports numbness along dorsal surface medial nerve distribution. Otherwise LUE sensation is intact   EDEMA: Mild swelling throughout L wrist   R Wrist: 17.5cm  L Wrist: 18cm  R/L MCP: 22cm    COGNITION: Overall cognitive status: Within functional limits for tasks assessed   OBSERVATIONS: Noted decreased ROM when attempting to extend 3rd and 4th  digit with hand flat on table. Pt able to make full fist and extend finger out of fist fully.    TODAY'S TREATMENT:                                                                                                                              DATE:  03/09/22 -manual therapy: myofascial release and trigger point applied to the L forearm, wrist, and hand in order to reduce fascial restrictions and pain, to improve ROM for ADL's.  -P/ROM: Wrist extension, wrist flexion, ulnar deviation, radial deviation, supination, pronation, 1x10 -Digit Blocking: blocking at each joint to encourage further flexion in each finger -Digit opposition,  x5 with each finger to the thumb -pinch strengthening: yellow, red, and green resistance clips, D3 and D4 to D2 for pinch up x15, tripod pinch down x15 -A/ROM: Wrist extension, wrist flexion, ulnar deviation, radial deviation, supination, pronation, 1x10  03/04/22 -manual therapy: myofascial release and trigger point applied to the L forearm, wrist, and hand in order to reduce fascial restrictions and pain, to improve ROM for ADL's.  -P/ROM: Wrist extension, wrist flexion, ulnar deviation, radial deviation, supination, pronation, 1x10 -Tendon glide: digits and wrist extended, flex digits at PIP and DIP, flex digits at MCP, PIP, and DIP, extend digit PIP and DIP keeping MCP flexed, extend all digits and wrist, 1x10 -Median nerve floss: shoulder abducted to 90 degrees with elbow and wrist extended with palm up, neck laterally flexed to the R shoulder, slowly extended wrist with digits extended, 1x10 -A/ROM: Wrist extension, wrist flexion, ulnar deviation, radial deviation, supination, pronation, 1x10  03/02/22 -manual therapy: myofascial release and trigger point applied to the L forearm, wrist, and hand in order to reduce fascial restrictions and pain, to improve ROM for ADL's.  -P/ROM: Wrist extension, wrist flexion, ulnar deviation, radial deviation, supination, pronation, 1x10 -A/ROM: Wrist extension, wrist flexion, ulnar deviation, radial deviation, supination, pronation, 1x10 -Theraputty: red theraputty, roll into a ball, flatten into a pancake, roll into a log, lateral pinch, roll into a log, tripod pinches, roll into a ball, squeeze ball x10   PATIENT EDUCATION: Education details: Reviewed HEP Person educated: Patient Education method: Explanation Education comprehension: verbalized understanding  HOME EXERCISE PROGRAM: 02/26/22:Wrist A/ROM 03/02/22: Theraputty 03/03/22: Median Nerve Floss  GOALS: Goals reviewed with patient? Yes  SHORT TERM GOALS: Target date: 04/07/2022    Pt  will be provided with and educated on HEP to improve ability to use LUE as dominant during ADLs.   Goal status: IN PROGRESS  2.  Pt will increase left wrist A/ROM to [facility] to improve ability to perform functional reaching during dressing and bathing tasks using LUE.   Goal status: IN PROGRESS  3.  Pt will increase left wrist strength to 4+/5 or greater to improve ability to lift pots and pans during cooking tasks.   Goal status: IN PROGRESS  4.  Pt will increase left grip strength by 15# to improve ability  to perform gripping and lifting tasks using left hand as dominant.   Goal status: IN PROGRESS  5.  Pt will be educated on splint wear and care to improve ability to use LUE as assist during ADLs while using splint for protection and support.   Goal status: IN PROGRESS  6.  Pt will decrease pain in left wrist to improve ability to sleep for 3+ hours without waking due to pain.    Goal status: IN PROGRESS   ASSESSMENT:  CLINICAL IMPRESSION: Pt presents to this session with decreased pain due to wearing his splint consistently for the past few days. He continues to have significant pain when moving his wrist with P/ROM and A/ROM. This session put more emphasis on his finger ROM and coordination/pinch strength as he reports that he is frequently dropping things and having numbness in his D3 and D4 fingers. Therapist providing min-mod verbal cuing for technique and proper positioning. He continues to benefit from skilled OT to maximize ROM and strength, in order to improve ADL and IADL independence.     PLAN:  OT FREQUENCY: 2x/week  OT DURATION: 4 weeks  PLANNED INTERVENTIONS: self care/ADL training, therapeutic exercise, therapeutic activity, manual therapy, passive range of motion, splinting, electrical stimulation, ultrasound, compression bandaging, patient/family education, and DME and/or AE instructions  CONSULTED AND AGREED WITH PLAN OF CARE: Patient  PLAN FOR NEXT SESSION:  Follow up on splint wear and HEP, continue with wrist A/ROM, adjust splint as needed, begin hand and digit A/ROM and strengthening   Paulita Fujita, OTR/L [phone] 03/10/2022, 8:25 PM

## 2022-03-10 ENCOUNTER — Encounter (HOSPITAL_COMMUNITY): Payer: Self-pay | Admitting: Occupational Therapy

## 2022-03-10 ENCOUNTER — Ambulatory Visit (INDEPENDENT_AMBULATORY_CARE_PROVIDER_SITE_OTHER): Admitting: Orthopedic Surgery

## 2022-03-10 ENCOUNTER — Encounter: Payer: Self-pay | Admitting: Orthopedic Surgery

## 2022-03-10 DIAGNOSIS — G5600 Carpal tunnel syndrome, unspecified upper limb: Secondary | ICD-10-CM | POA: Diagnosis not present

## 2022-03-10 DIAGNOSIS — S60212D Contusion of left wrist, subsequent encounter: Secondary | ICD-10-CM | POA: Diagnosis not present

## 2022-03-10 NOTE — Progress Notes (Signed)
FOLLOW UP   Encounter Diagnoses  Name Primary?   Contusion of left wrist, subsequent encounter Yes   Median nerve compression      Chief Complaint  Patient presents with   Wrist Pain    Left has splint and has been going for therapy 01/06/22   Follow-up 66 year old male injured at work his hand and wrist were caught on a conveyor belt when a load of product came down [address] he has nerve contusion and wrist contusion  He is complaining of decreased sensation in his long and portion of his ring finger and weakness in his left hand.  His range of motion is improving in the splint he is undergoing occupational therapy and is making improvement  He is not able to make a closed hand all the way and has weak grip  He should continue his work restrictions in his therapy and follow-up in 4 weeks

## 2022-03-10 NOTE — Patient Instructions (Signed)
Continue work restrictions 

## 2022-03-11 ENCOUNTER — Ambulatory Visit (HOSPITAL_COMMUNITY): Admitting: Occupational Therapy

## 2022-03-11 ENCOUNTER — Encounter (HOSPITAL_COMMUNITY): Payer: Self-pay | Admitting: Occupational Therapy

## 2022-03-11 DIAGNOSIS — M25532 Pain in left wrist: Secondary | ICD-10-CM | POA: Diagnosis not present

## 2022-03-11 DIAGNOSIS — R600 Localized edema: Secondary | ICD-10-CM

## 2022-03-11 DIAGNOSIS — R29898 Other symptoms and signs involving the musculoskeletal system: Secondary | ICD-10-CM

## 2022-03-11 NOTE — Therapy (Signed)
OUTPATIENT OCCUPATIONAL THERAPY ORTHO TREATMENT NOTE  Patient Name: Darrell Parker MRN: [id number] DOB:1956-03-16, 66 y.o., male Today's Date: 03/11/2022  PCP: None REFERRING PROVIDER: Dr. Arther Abbott  END OF SESSION:  OT End of Session - 03/11/22 1121     Visit Number 5    Number of Visits 9    Date for OT Re-Evaluation 03/28/22    Authorization Type workers comp    Authorization Time Period waiting on paperwork;    OT Start Time 1020    OT Stop Time 1110    OT Time Calculation (min) 50 min    Activity Tolerance Patient tolerated treatment well    Behavior During Therapy [facility] for tasks assessed/performed            Past Medical History:  Diagnosis Date   Hearing deficit    Pneumonia    Past Surgical History:  Procedure Laterality Date   KNEE SURGERY     Patient Active Problem List   Diagnosis Date Noted   TIA (transient ischemic attack) 03/15/2018   Tobacco use 03/15/2018   HTN (hypertension) 03/15/2018   Microscopic hematuria 03/15/2018   Insomnia 03/15/2018   RUE numbness 03/15/2018    ONSET DATE: 01/06/22  REFERRING DIAG: Contusion of left wrist, peripheral nerve contusion  THERAPY DIAG:  Pain in left wrist  Localized edema  Other symptoms and signs involving the musculoskeletal system  Rationale for Evaluation and Treatment: Rehabilitation  SUBJECTIVE:   SUBJECTIVE STATEMENT: S: I've been working on my gripping since I worked with you last.  PRECAUTIONS: None  WEIGHT BEARING RESTRICTIONS: No  PAIN:  Are you having pain? Yes: NPRS scale: 6/10 Pain location: left dorsal wrist Pain description: aching, throbbing Aggravating factors: movement Relieving factors: rest  PATIENT GOALS: To be able to use his left hand and go back to work.   OBJECTIVE:   HAND DOMINANCE: Left  ADLs: Overall ADLs: Pt is having difficulty using dominant LUE during ADLs. Pt is unable to lift pots and pans for cooking, has difficulty lifting cups to take a drink.  Opening jars and containers is difficulty. Unable to complete buttons, zippers, ties with left hand. Unable to use the can opener.    FUNCTIONAL OUTCOME MEASURES: Quick Dash: 81.82  UPPER EXTREMITY ROM:     Active ROM Left eval  Wrist flexion 38  Wrist extension 15  Wrist ulnar deviation 32  Wrist radial deviation 20  Wrist pronation 90  Wrist supination 90  (Blank rows = not tested)    *Pt able to flex and extend digits into a full fist.    UPPER EXTREMITY MMT:     MMT Left eval  Wrist flexion 3/5  Wrist extension 3/5  Wrist ulnar deviation 3/5  Wrist radial deviation 3/5  Wrist pronation 3/5  Wrist supination 3//5  (Blank rows = not tested)  HAND FUNCTION: Grip strength: Right: 75 lbs; Left: 58 lbs, Lateral pinch: Right: 22 lbs, Left: 11 lbs, and 3 point pinch: Right: 16 lbs, Left: 8 lbs *Red dynamometer  COORDINATION: Not tested. Will test at a later date if needed.   SENSATION: Pt reports numbness along dorsal surface medial nerve distribution. Otherwise LUE sensation is intact   EDEMA: Mild swelling throughout L wrist   R Wrist: 17.5cm  L Wrist: 18cm  R/L MCP: 22cm    COGNITION: Overall cognitive status: Within functional limits for tasks assessed   OBSERVATIONS: Noted decreased ROM when attempting to extend 3rd and 4th digit with hand flat on  table. Pt able to make full fist and extend finger out of fist fully.    TODAY'S TREATMENT:                                                                                                                              DATE:  03/11/22 -manual therapy: myofascial release and trigger point applied to the L forearm, wrist, and hand in order to reduce fascial restrictions and pain, to improve ROM for ADL's.  -P/ROM: Wrist extension, wrist flexion, ulnar deviation, radial deviation, supination, pronation, 1x10 -scrub and carry: scrub 1 min, carry 5lb dumbbell 1 min, x2 sets -Digit composite flexion, 1x10, able to create  a full fist with increased pain and pt reports popping sensation -Digit opposition, x5 with each finger to the thumb  03/09/22 -manual therapy: myofascial release and trigger point applied to the L forearm, wrist, and hand in order to reduce fascial restrictions and pain, to improve ROM for ADL's.  -P/ROM: Wrist extension, wrist flexion, ulnar deviation, radial deviation, supination, pronation, 1x10 -Digit Blocking: blocking at each joint to encourage further flexion in each finger -Digit opposition, x5 with each finger to the thumb -pinch strengthening: yellow, red, and green resistance clips, D3 and D4 to D2 for pinch up x15, tripod pinch down x15 -A/ROM: Wrist extension, wrist flexion, ulnar deviation, radial deviation, supination, pronation, 1x10  03/04/22 -manual therapy: myofascial release and trigger point applied to the L forearm, wrist, and hand in order to reduce fascial restrictions and pain, to improve ROM for ADL's.  -P/ROM: Wrist extension, wrist flexion, ulnar deviation, radial deviation, supination, pronation, 1x10 -Tendon glide: digits and wrist extended, flex digits at PIP and DIP, flex digits at MCP, PIP, and DIP, extend digit PIP and DIP keeping MCP flexed, extend all digits and wrist, 1x10 -Median nerve floss: shoulder abducted to 90 degrees with elbow and wrist extended with palm up, neck laterally flexed to the R shoulder, slowly extended wrist with digits extended, 1x10 -A/ROM: Wrist extension, wrist flexion, ulnar deviation, radial deviation, supination, pronation, 1x10  PATIENT EDUCATION: Education details: Scrub and carry Person educated: Patient Education method: Explanation Education comprehension: verbalized understanding  HOME EXERCISE PROGRAM: 02/26/22:Wrist A/ROM 03/02/22: Theraputty 03/03/22: Median Nerve Floss 03/11/22: Scrub and carry  GOALS: Goals reviewed with patient? Yes  SHORT TERM GOALS: Target date: 04/08/2022    Pt will be provided with and  educated on HEP to improve ability to use LUE as dominant during ADLs.   Goal status: IN PROGRESS  2.  Pt will increase left wrist A/ROM to [facility] to improve ability to perform functional reaching during dressing and bathing tasks using LUE.   Goal status: IN PROGRESS  3.  Pt will increase left wrist strength to 4+/5 or greater to improve ability to lift pots and pans during cooking tasks.   Goal status: IN PROGRESS  4.  Pt will increase left grip strength by 15# to improve ability to perform gripping  and lifting tasks using left hand as dominant.   Goal status: IN PROGRESS  5.  Pt will be educated on splint wear and care to improve ability to use LUE as assist during ADLs while using splint for protection and support.   Goal status: IN PROGRESS  6.  Pt will decrease pain in left wrist to improve ability to sleep for 3+ hours without waking due to pain.    Goal status: IN PROGRESS   ASSESSMENT:  CLINICAL IMPRESSION: Pt continues to have pain and swelling in the dorsal aspect of his wrist. Pt had a follow up with his orthopedic on 03/10/22, who stated that he has a median nerve contusion and needs to continue with his splint and therapy. This session therapist added scrub and carry to start working on functional mobility and grip strength. He is able to hold a 5lb dumbbell by his side, however he is unable move his wrist due to weakness with this weight. Therapist providing verbal cuing and visual cuing for technique and positioning. He continues to benefit from skilled OT to maximize ROM and strength, in order to improve ADL and IADL independence.     PLAN:  OT FREQUENCY: 2x/week  OT DURATION: 4 weeks  PLANNED INTERVENTIONS: self care/ADL training, therapeutic exercise, therapeutic activity, manual therapy, passive range of motion, splinting, electrical stimulation, ultrasound, compression bandaging, patient/family education, and DME and/or AE instructions  CONSULTED AND AGREED  WITH PLAN OF CARE: Patient  PLAN FOR NEXT SESSION:continue with wrist A/ROM, adjust splint as needed, begin hand and digit A/ROM and strengthening   Paulita Fujita, OTR/L [phone] 03/11/2022, 11:22 AM

## 2022-03-15 ENCOUNTER — Telehealth: Payer: Self-pay | Admitting: Radiology

## 2022-03-15 NOTE — Telephone Encounter (Signed)
Wanting to know if patient attended 03/10/22 appt, get notes and know next appt or orders.

## 2022-03-16 ENCOUNTER — Ambulatory Visit (HOSPITAL_COMMUNITY): Admitting: Occupational Therapy

## 2022-03-16 ENCOUNTER — Encounter (HOSPITAL_COMMUNITY): Payer: Self-pay | Admitting: Occupational Therapy

## 2022-03-16 DIAGNOSIS — R29898 Other symptoms and signs involving the musculoskeletal system: Secondary | ICD-10-CM

## 2022-03-16 DIAGNOSIS — M25532 Pain in left wrist: Secondary | ICD-10-CM

## 2022-03-16 DIAGNOSIS — R600 Localized edema: Secondary | ICD-10-CM

## 2022-03-16 NOTE — Therapy (Signed)
OUTPATIENT OCCUPATIONAL THERAPY ORTHO TREATMENT NOTE  Patient Name: Darrell Parker MRN: [id number] DOB:02-Sep-1955, 66 y.o., male Today's Date: 03/16/2022  PCP: None REFERRING PROVIDER: Dr. Fuller Canada  END OF SESSION:  OT End of Session - 03/16/22 0934     Visit Number 6    Number of Visits 9    Date for OT Re-Evaluation 03/28/22    Authorization Type workers comp    Authorization Time Period waiting on paperwork;    OT Start Time [phone]    OT Stop Time 0935    OT Time Calculation (min) 40 min    Activity Tolerance Patient tolerated treatment well    Behavior During Therapy WFL for tasks assessed/performed             Past Medical History:  Diagnosis Date   Hearing deficit    Pneumonia    Past Surgical History:  Procedure Laterality Date   KNEE SURGERY     Patient Active Problem List   Diagnosis Date Noted   TIA (transient ischemic attack) 03/15/2018   Tobacco use 03/15/2018   HTN (hypertension) 03/15/2018   Microscopic hematuria 03/15/2018   Insomnia 03/15/2018   RUE numbness 03/15/2018    ONSET DATE: 01/06/22  REFERRING DIAG: Contusion of left wrist, peripheral nerve contusion  THERAPY DIAG:  Pain in left wrist  Localized edema  Other symptoms and signs involving the musculoskeletal system  Rationale for Evaluation and Treatment: Rehabilitation  SUBJECTIVE:   SUBJECTIVE STATEMENT: S: If I move my hand like this (radial/ulnar deviation) it feels like the pain is releasing, but if I move it like this (flexion) it hurts and swells more.   PRECAUTIONS: None  WEIGHT BEARING RESTRICTIONS: No  PAIN:  Are you having pain? Yes: NPRS scale: 7/10 Pain location: left dorsal wrist Pain description: aching, throbbing Aggravating factors: movement Relieving factors: rest  PATIENT GOALS: To be able to use his left hand and go back to work.   OBJECTIVE:   HAND DOMINANCE: Left  ADLs: Overall ADLs: Pt is having difficulty using dominant LUE during  ADLs. Pt is unable to lift pots and pans for cooking, has difficulty lifting cups to take a drink. Opening jars and containers is difficulty. Unable to complete buttons, zippers, ties with left hand. Unable to use the can opener.    FUNCTIONAL OUTCOME MEASURES: Quick Dash: 81.82  UPPER EXTREMITY ROM:     Active ROM Left eval  Wrist flexion 38  Wrist extension 15  Wrist ulnar deviation 32  Wrist radial deviation 20  Wrist pronation 90  Wrist supination 90  (Blank rows = not tested)    *Pt able to flex and extend digits into a full fist.    UPPER EXTREMITY MMT:     MMT Left eval  Wrist flexion 3/5  Wrist extension 3/5  Wrist ulnar deviation 3/5  Wrist radial deviation 3/5  Wrist pronation 3/5  Wrist supination 3//5  (Blank rows = not tested)  HAND FUNCTION: Grip strength: Right: 75 lbs; Left: 58 lbs, Lateral pinch: Right: 22 lbs, Left: 11 lbs, and 3 point pinch: Right: 16 lbs, Left: 8 lbs *Red dynamometer  COORDINATION: Not tested. Will test at a later date if needed.   SENSATION: Pt reports numbness along dorsal surface medial nerve distribution. Otherwise LUE sensation is intact   EDEMA: Mild swelling throughout L wrist   R Wrist: 17.5cm  L Wrist: 18cm  R/L MCP: 22cm    COGNITION: Overall cognitive status: Within functional limits for tasks  assessed   OBSERVATIONS: Noted decreased ROM when attempting to extend 3rd and 4th digit with hand flat on table. Pt able to make full fist and extend finger out of fist fully.    TODAY'S TREATMENT:                                                                                                                              DATE:  03/16/22 -A/ROM: seated with forearm propped on weighted ball-wrist flexion/extension, radial/ulnar deviation, 10 reps each, 2 sets -Paper towel crumple: 10 reps -Hand gripper: large and medium with vertical gripper, and small beads with horizontal gripper all at 25#; rest breaks as  needed -A/ROM: forearm supination/pronation, 10 reps, arm adducted to side  03/11/22 -manual therapy: myofascial release and trigger point applied to the L forearm, wrist, and hand in order to reduce fascial restrictions and pain, to improve ROM for ADL's.  -P/ROM: Wrist extension, wrist flexion, ulnar deviation, radial deviation, supination, pronation, 1x10 -scrub and carry: scrub 1 min, carry 5lb dumbbell 1 min, x2 sets -Digit composite flexion, 1x10, able to create a full fist with increased pain and pt reports popping sensation -Digit opposition, x5 with each finger to the thumb  03/09/22 -manual therapy: myofascial release and trigger point applied to the L forearm, wrist, and hand in order to reduce fascial restrictions and pain, to improve ROM for ADL's.  -P/ROM: Wrist extension, wrist flexion, ulnar deviation, radial deviation, supination, pronation, 1x10 -Digit Blocking: blocking at each joint to encourage further flexion in each finger -Digit opposition, x5 with each finger to the thumb -pinch strengthening: yellow, red, and green resistance clips, D3 and D4 to D2 for pinch up x15, tripod pinch down x15 -A/ROM: Wrist extension, wrist flexion, ulnar deviation, radial deviation, supination, pronation, 1x10    PATIENT EDUCATION: Education details: Discussed HEP completion Person educated: Patient Education method: Explanation Education comprehension: verbalized understanding  HOME EXERCISE PROGRAM: 02/26/22:Wrist A/ROM 03/02/22: Theraputty 03/03/22: Median Nerve Floss 03/11/22: Scrub and carry  GOALS: Goals reviewed with patient? Yes  SHORT TERM GOALS: Target date: 04/13/2022    Pt will be provided with and educated on HEP to improve ability to use LUE as dominant during ADLs.   Goal status: IN PROGRESS  2.  Pt will increase left wrist A/ROM to [facility] to improve ability to perform functional reaching during dressing and bathing tasks using LUE.   Goal status: IN  PROGRESS  3.  Pt will increase left wrist strength to 4+/5 or greater to improve ability to lift pots and pans during cooking tasks.   Goal status: IN PROGRESS  4.  Pt will increase left grip strength by 15# to improve ability to perform gripping and lifting tasks using left hand as dominant.   Goal status: IN PROGRESS  5.  Pt will be educated on splint wear and care to improve ability to use LUE as assist during ADLs while using splint for protection and support.  Goal status: IN PROGRESS  6.  Pt will decrease pain in left wrist to improve ability to sleep for 3+ hours without waking due to pain.    Goal status: IN PROGRESS   ASSESSMENT:  CLINICAL IMPRESSION: Pt reporting continued pain in the left wrist, notably with flexion. Discussed nerve pain and function with pt, explained that during flexion the nerve is stretching and has more stress placed on it. Did not complete manual techniques due to increased pain and tenderness with palpation. Pt completing A/ROM, gentle grip strengthening, completing 2 sets with rest breaks in between. Added hand gripper task, pt able to complete, does have slightly increased pain at end of task. Pt with mod fatigue at end of session, noted with mild shaking during A/ROM. Verbal cuing for pt to unclench his hand and relax his hand and fingers during A/ROM. Verbal cuing for form and technique during exercises.    PLAN:  OT FREQUENCY: 2x/week  OT DURATION: 4 weeks  PLANNED INTERVENTIONS: self care/ADL training, therapeutic exercise, therapeutic activity, manual therapy, passive range of motion, splinting, electrical stimulation, ultrasound, compression bandaging, patient/family education, and DME and/or AE instructions  CONSULTED AND AGREED WITH PLAN OF CARE: Patient  PLAN FOR NEXT SESSION:continue with wrist A/ROM, adjust splint as needed, gentle grip/pinch strengthening; reminders to relax hand during exercises   Ezra Sites, OTR/L   [phone] 03/16/2022, 9:35 AM

## 2022-03-18 ENCOUNTER — Encounter (HOSPITAL_COMMUNITY): Payer: Self-pay | Admitting: Occupational Therapy

## 2022-03-18 ENCOUNTER — Ambulatory Visit (HOSPITAL_COMMUNITY): Admitting: Occupational Therapy

## 2022-03-18 DIAGNOSIS — M25532 Pain in left wrist: Secondary | ICD-10-CM

## 2022-03-18 DIAGNOSIS — R29898 Other symptoms and signs involving the musculoskeletal system: Secondary | ICD-10-CM | POA: Diagnosis present

## 2022-03-18 DIAGNOSIS — R600 Localized edema: Secondary | ICD-10-CM | POA: Diagnosis present

## 2022-03-18 NOTE — Therapy (Signed)
OUTPATIENT OCCUPATIONAL THERAPY ORTHO TREATMENT NOTE  Patient Name: Darrell Parker MRN: [id number] DOB:07-09-1955, 66 y.o., male Today's Date: 03/19/2022  PCP: None REFERRING PROVIDER: Dr. Fuller Canada  END OF SESSION:  OT End of Session - 03/18/22 1030     Visit Number 7    Number of Visits 9    Date for OT Re-Evaluation 03/28/22    Authorization Type workers comp    Authorization Time Period waiting on paperwork;    OT Start Time 0940    OT Stop Time 1025    OT Time Calculation (min) 45 min    Activity Tolerance Patient tolerated treatment well    Behavior During Therapy [facility] for tasks assessed/performed            Past Medical History:  Diagnosis Date   Hearing deficit    Pneumonia    Past Surgical History:  Procedure Laterality Date   KNEE SURGERY     Patient Active Problem List   Diagnosis Date Noted   TIA (transient ischemic attack) 03/15/2018   Tobacco use 03/15/2018   HTN (hypertension) 03/15/2018   Microscopic hematuria 03/15/2018   Insomnia 03/15/2018   RUE numbness 03/15/2018    ONSET DATE: 01/06/22  REFERRING DIAG: Contusion of left wrist, peripheral nerve contusion  THERAPY DIAG:  Pain in left wrist  Localized edema  Other symptoms and signs involving the musculoskeletal system  Rationale for Evaluation and Treatment: Rehabilitation  SUBJECTIVE:   SUBJECTIVE STATEMENT: S: "I still can't carry anything."   PRECAUTIONS: None  WEIGHT BEARING RESTRICTIONS: No  PAIN:  Are you having pain? Yes: NPRS scale: 7/10 Pain location: left dorsal wrist Pain description: aching, throbbing Aggravating factors: movement Relieving factors: rest  PATIENT GOALS: To be able to use his left hand and go back to work.   OBJECTIVE:   HAND DOMINANCE: Left  ADLs: Overall ADLs: Pt is having difficulty using dominant LUE during ADLs. Pt is unable to lift pots and pans for cooking, has difficulty lifting cups to take a drink. Opening jars and containers  is difficulty. Unable to complete buttons, zippers, ties with left hand. Unable to use the can opener.    FUNCTIONAL OUTCOME MEASURES: Quick Dash: 81.82  UPPER EXTREMITY ROM:     Active ROM Left eval  Wrist flexion 38  Wrist extension 15  Wrist ulnar deviation 32  Wrist radial deviation 20  Wrist pronation 90  Wrist supination 90  (Blank rows = not tested)    *Pt able to flex and extend digits into a full fist.    UPPER EXTREMITY MMT:     MMT Left eval  Wrist flexion 3/5  Wrist extension 3/5  Wrist ulnar deviation 3/5  Wrist radial deviation 3/5  Wrist pronation 3/5  Wrist supination 3//5  (Blank rows = not tested)  HAND FUNCTION: Grip strength: Right: 75 lbs; Left: 58 lbs, Lateral pinch: Right: 22 lbs, Left: 11 lbs, and 3 point pinch: Right: 16 lbs, Left: 8 lbs *Red dynamometer  COORDINATION: Not tested. Will test at a later date if needed.   SENSATION: Pt reports numbness along dorsal surface medial nerve distribution. Otherwise LUE sensation is intact   EDEMA: Mild swelling throughout L wrist   R Wrist: 17.5cm  L Wrist: 18cm  R/L MCP: 22cm    COGNITION: Overall cognitive status: Within functional limits for tasks assessed   OBSERVATIONS: Noted decreased ROM when attempting to extend 3rd and 4th digit with hand flat on table. Pt able to make full  fist and extend finger out of fist fully.    TODAY'S TREATMENT:                                                                                                                              DATE:  03/18/22 -A/ROM: Wrist extension, flexion, ulnar deviation, radial deviation, supination, pronation, 1x10 -Theraputty: roll into a ball and pull 10 marbles out -Theraband: wrist flexion, extension, ulnar deviation, radial deviation, supination, pronation, 1x10 -Gripper: 29# squeeze x10, pick up 5 medium beads -Digiflex: 3lb, full squeeze x10, each digit squeeze x5 each  03/16/22 -A/ROM: seated with forearm propped  on weighted ball-wrist flexion/extension, radial/ulnar deviation, 10 reps each, 2 sets -Paper towel crumple: 10 reps -Hand gripper: large and medium with vertical gripper, and small beads with horizontal gripper all at 25#; rest breaks as needed -A/ROM: forearm supination/pronation, 10 reps, arm adducted to side  03/11/22 -manual therapy: myofascial release and trigger point applied to the L forearm, wrist, and hand in order to reduce fascial restrictions and pain, to improve ROM for ADL's.  -P/ROM: Wrist extension, wrist flexion, ulnar deviation, radial deviation, supination, pronation, 1x10 -scrub and carry: scrub 1 min, carry 5lb dumbbell 1 min, x2 sets -Digit composite flexion, 1x10, able to create a full fist with increased pain and pt reports popping sensation -Digit opposition, x5 with each finger to the thumb   PATIENT EDUCATION: Education details: Wrist strengthening, red theraband Person educated: Patient Education method: Explanation Education comprehension: verbalized understanding  HOME EXERCISE PROGRAM: 02/26/22:Wrist A/ROM 03/02/22: Theraputty 03/03/22: Median Nerve Floss 03/11/22: Scrub and carry 03/18/22: Wrist strengthening, red theraband  GOALS: Goals reviewed with patient? Yes  SHORT TERM GOALS: Target date: 04/16/2022    Pt will be provided with and educated on HEP to improve ability to use LUE as dominant during ADLs.   Goal status: IN PROGRESS  2.  Pt will increase left wrist A/ROM to [facility] to improve ability to perform functional reaching during dressing and bathing tasks using LUE.   Goal status: IN PROGRESS  3.  Pt will increase left wrist strength to 4+/5 or greater to improve ability to lift pots and pans during cooking tasks.   Goal status: IN PROGRESS  4.  Pt will increase left grip strength by 15# to improve ability to perform gripping and lifting tasks using left hand as dominant.   Goal status: IN PROGRESS  5.  Pt will be educated on splint  wear and care to improve ability to use LUE as assist during ADLs while using splint for protection and support.   Goal status: IN PROGRESS  6.  Pt will decrease pain in left wrist to improve ability to sleep for 3+ hours without waking due to pain.    Goal status: IN PROGRESS   ASSESSMENT:  CLINICAL IMPRESSION: This session, pt working on improving grip and pinching strength. He reports continued pain with flexion in his wrist, however was able to tolerate  focusing more on gripping and using theraputty, with therapist providing mod cuing for his wrist to remain in neutral. Pt was able to increase grip pounds this session from previous session, demonstrating increasing strength. He will continue to benefit from skilled OT to maximize independence with ADL's and IADL's, by improving strength and decreasing pain.    PLAN:  OT FREQUENCY: 2x/week  OT DURATION: 4 weeks  PLANNED INTERVENTIONS: self care/ADL training, therapeutic exercise, therapeutic activity, manual therapy, passive range of motion, splinting, electrical stimulation, ultrasound, compression bandaging, patient/family education, and DME and/or AE instructions  CONSULTED AND AGREED WITH PLAN OF CARE: Patient  PLAN FOR NEXT SESSION:continue with wrist A/ROM, adjust splint as needed, gentle grip/pinch strengthening; reminders to relax hand during exercises   Trish Mage, OTR/L [phone] 03/19/2022, 9:22 AM

## 2022-03-18 NOTE — Patient Instructions (Signed)
Strengthening Exercises  1) WRIST EXTENSION CURLS - TABLE  Hold a small free weight, rest your forearm on a table and bend your wrist up and down with your palm face down as shown.      2) WRIST FLEXION CURLS - TABLE  Hold a small free weight, rest your forearm on a table and bend your wrist up and down with your palm face up as shown.     3) FREE WEIGHT RADIAL/ULNAR DEVIATION - TABLE  Hold a small free weight, rest your forearm on a table and bend your wrist up and down with your palm facing towards the side as shown.     4) Pronation  Forearm supported on table with wrist in neutral position. Using a weight, roll wrist so that palm faces downward. Hold for 2 seconds and return to starting position.     5) Supination  Forearm supported on table with wrist in neutral position. Using a weight, roll wrist so that palm is now facing upward. Hold for 2 seconds and return to starting position.      *Complete exercises using ____ pound weight, ____times each, ____times per day*  

## 2022-03-19 ENCOUNTER — Encounter (HOSPITAL_COMMUNITY): Payer: Self-pay | Admitting: Occupational Therapy

## 2022-03-22 ENCOUNTER — Encounter (HOSPITAL_COMMUNITY): Payer: Self-pay | Admitting: Occupational Therapy

## 2022-03-22 ENCOUNTER — Ambulatory Visit (HOSPITAL_COMMUNITY): Admitting: Occupational Therapy

## 2022-03-22 ENCOUNTER — Encounter: Payer: Self-pay | Admitting: Orthopedic Surgery

## 2022-03-22 DIAGNOSIS — R600 Localized edema: Secondary | ICD-10-CM

## 2022-03-22 DIAGNOSIS — M25532 Pain in left wrist: Secondary | ICD-10-CM | POA: Diagnosis not present

## 2022-03-22 DIAGNOSIS — R29898 Other symptoms and signs involving the musculoskeletal system: Secondary | ICD-10-CM

## 2022-03-22 NOTE — Therapy (Addendum)
OUTPATIENT OCCUPATIONAL THERAPY ORTHO TREATMENT NOTE  Patient Name: Darrell Parker MRN: [id number] DOB:Feb 16, 1956, 66 y.o., male Today's Date: 03/22/2022  PCP: None REFERRING PROVIDER: Dr. Arther Abbott  END OF SESSION:  OT End of Session - 03/22/22 1338     Visit Number 8    Number of Visits 9    Date for OT Re-Evaluation 03/28/22    Authorization Type workers comp    Authorization Time Period waiting on paperwork;    OT Start Time 1302    OT Stop Time 1340    OT Time Calculation (min) 38 min    Activity Tolerance Patient tolerated treatment well    Behavior During Therapy WFL for tasks assessed/performed             Past Medical History:  Diagnosis Date   Hearing deficit    Pneumonia    Past Surgical History:  Procedure Laterality Date   KNEE SURGERY     Patient Active Problem List   Diagnosis Date Noted   TIA (transient ischemic attack) 03/15/2018   Tobacco use 03/15/2018   HTN (hypertension) 03/15/2018   Microscopic hematuria 03/15/2018   Insomnia 03/15/2018   RUE numbness 03/15/2018    ONSET DATE: 01/06/22  REFERRING DIAG: Contusion of left wrist, peripheral nerve contusion  THERAPY DIAG:  Pain in left wrist  Other symptoms and signs involving the musculoskeletal system  Localized edema  Rationale for Evaluation and Treatment: Rehabilitation  SUBJECTIVE:   SUBJECTIVE STATEMENT: S: "I feel like it's not getting swelled up as bad."   PRECAUTIONS: None  WEIGHT BEARING RESTRICTIONS: No  PAIN:  Are you having pain? Yes: NPRS scale: 6/10 Pain location: left dorsal wrist Pain description: aching, throbbing Aggravating factors: movement Relieving factors: rest  PATIENT GOALS: To be able to use his left hand and go back to work.   OBJECTIVE:   HAND DOMINANCE: Left  ADLs: Overall ADLs: Pt is having difficulty using dominant LUE during ADLs. Pt is unable to lift pots and pans for cooking, has difficulty lifting cups to take a drink. Opening  jars and containers is difficulty. Unable to complete buttons, zippers, ties with left hand. Unable to use the can opener.    FUNCTIONAL OUTCOME MEASURES: Quick Dash: 81.82  UPPER EXTREMITY ROM:     Active ROM Left eval  Wrist flexion 38  Wrist extension 15  Wrist ulnar deviation 32  Wrist radial deviation 20  Wrist pronation 90  Wrist supination 90  (Blank rows = not tested)    *Pt able to flex and extend digits into a full fist.    UPPER EXTREMITY MMT:     MMT Left eval  Wrist flexion 3/5  Wrist extension 3/5  Wrist ulnar deviation 3/5  Wrist radial deviation 3/5  Wrist pronation 3/5  Wrist supination 3//5  (Blank rows = not tested)  HAND FUNCTION: Grip strength: Right: 75 lbs; Left: 58 lbs, Lateral pinch: Right: 22 lbs, Left: 11 lbs, and 3 point pinch: Right: 16 lbs, Left: 8 lbs *Red dynamometer  COORDINATION: Not tested. Will test at a later date if needed.   SENSATION: Pt reports numbness along dorsal surface medial nerve distribution. Otherwise LUE sensation is intact   EDEMA: Mild swelling throughout L wrist   R Wrist: 17.5cm  L Wrist: 18cm  R/L MCP: 22cm    COGNITION: Overall cognitive status: Within functional limits for tasks assessed   OBSERVATIONS: Noted decreased ROM when attempting to extend 3rd and 4th digit with hand flat on  table. Pt able to make full fist and extend finger out of fist fully.    TODAY'S TREATMENT:                                                                                                                              DATE:   03/22/22 -A/ROM: seated with forearm propped on weighted ball-wrist flexion/extension, radial/ulnar deviation, 10 reps each -A/ROM: forearm supination/pronation, 10 reps, arm adducted to side -Wrist strengthening: 1#-wrist flexion, extension, radial/ulnar deviation, 10 reps -Hand gripper: large and medium beads with vertical gripper, small beads with horizontal gripper, 35# for all sizes, rest  breaks as needed -Pinch activity: pt using green clothespin and 3 point pinch to stack 3 towers of 5 sponges, working to open the clothespin all the way to grasp sponges. Pt then transitioned to lateral pinch to place 15 sponges into the bucket.  -Coordination task: pt holding coins in palm, working to translate to fingertips and then place into slotted container. Pt completing without dropping, focusing on wrist mobility during the task.   03/18/22 -A/ROM: Wrist extension, flexion, ulnar deviation, radial deviation, supination, pronation, 1x10 -Theraputty: roll into a ball and pull 10 marbles out -Theraband: wrist flexion, extension, ulnar deviation, radial deviation, supination, pronation, 1x10 -Gripper: 29# squeeze x10, pick up 5 medium beads -Digiflex: 3lb, full squeeze x10, each digit squeeze x5 each  03/16/22 -A/ROM: seated with forearm propped on weighted ball-wrist flexion/extension, radial/ulnar deviation, 10 reps each, 2 sets -Paper towel crumple: 10 reps -Hand gripper: large and medium with vertical gripper, and small beads with horizontal gripper all at 25#; rest breaks as needed -A/ROM: forearm supination/pronation, 10 reps, arm adducted to side     PATIENT EDUCATION: Education details: Discussed HEP Person educated: Patient Education method: Explanation Education comprehension: verbalized understanding  HOME EXERCISE PROGRAM: 02/26/22:Wrist A/ROM 03/02/22: Theraputty 03/03/22: Median Nerve Floss 03/11/22: Scrub and carry 03/18/22: Wrist strengthening, red theraband  GOALS: Goals reviewed with patient? Yes  SHORT TERM GOALS: Target date: 04/19/2022    Pt will be provided with and educated on HEP to improve ability to use LUE as dominant during ADLs.   Goal status: IN PROGRESS  2.  Pt will increase left wrist A/ROM to [facility] to improve ability to perform functional reaching during dressing and bathing tasks using LUE.   Goal status: IN PROGRESS  3.  Pt will  increase left wrist strength to 4+/5 or greater to improve ability to lift pots and pans during cooking tasks.   Goal status: IN PROGRESS  4.  Pt will increase left grip strength by 15# to improve ability to perform gripping and lifting tasks using left hand as dominant.   Goal status: IN PROGRESS  5.  Pt will be educated on splint wear and care to improve ability to use LUE as assist during ADLs while using splint for protection and support.   Goal status: IN PROGRESS  6.  Pt will decrease pain in  left wrist to improve ability to sleep for 3+ hours without waking due to pain.    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 He reports that the swelling seems to be decreasing, pain is slightly less, continues to have more pain with radial and ulnar deviation, new HEP is going well. OT notes shaking secondary to fatigue during A/ROM. Continued with hand gripper, increased to 35#, intermittent rest breaks provided. Added pinch activity today, fatigue noted, pt reports mild pain at ulnar styloid area. Coordination task focusing on wrist mobility during task.  Verbal cuing for form and technique during tasks.   PLAN:  OT FREQUENCY: 2x/week  OT DURATION: 4 weeks  PLANNED INTERVENTIONS: self care/ADL training, therapeutic exercise, therapeutic activity, manual therapy, passive range of motion, splinting, electrical stimulation, ultrasound, compression bandaging, patient/family education, and DME and/or AE instructions  CONSULTED AND AGREED WITH PLAN OF CARE: Patient  PLAN FOR NEXT SESSION:Reassessment, recert and add more visits or discharge with HEP   Ezra Sites, OTR/L  [phone] 03/22/2022, 1:41 PM

## 2022-03-24 ENCOUNTER — Ambulatory Visit (HOSPITAL_COMMUNITY): Payer: Self-pay | Attending: Orthopedic Surgery | Admitting: Occupational Therapy

## 2022-03-24 DIAGNOSIS — M25532 Pain in left wrist: Secondary | ICD-10-CM | POA: Insufficient documentation

## 2022-03-24 DIAGNOSIS — R29898 Other symptoms and signs involving the musculoskeletal system: Secondary | ICD-10-CM | POA: Insufficient documentation

## 2022-03-24 DIAGNOSIS — R600 Localized edema: Secondary | ICD-10-CM | POA: Insufficient documentation

## 2022-03-24 NOTE — Therapy (Signed)
OUTPATIENT OCCUPATIONAL THERAPY ORTHO TREATMENT NOTE  Patient Name: Darrell Parker MRN: [id number] DOB:11-Jun-1955, 66 y.o., male Today's Date: 03/24/2022  PCP: None REFERRING PROVIDER: Dr. Arther Abbott  OCCUPATIONAL THERAPY DISCHARGE SUMMARY  Visits from Start of Care: 9  Current functional level related to goals / functional outcomes: Pt has met 5 out of 6 goals. He has been provided a comprehensive HEP. He demonstrates ROM to [facility], his overall wrist strength, grip, and pinch strength have improved greatly. Pt reports independence with all ADL's and IADL's, and wishes to go back to work.    Remaining deficits: Pt did not meet 1 out of 6 goals, which was to decrease pain. Pt continues to have high pain levels with associated swelling. Per MD, pt has nerve damage which will take increased time to repair, therefore he will continue to have higher pain levels until further in the nerve healing process.    Education / Equipment: Pt has been provided a comprehensive HEP, along with theraputty and theraband's to continue progressing strength. Additionally, pt has the splint OT fabricated for him, along with an edema glove.    Plan: Patient agrees to discharge as he has met all but 1 therapy goal and agrees to continue following his HEP for continued progress.      END OF SESSION:   03/24/22 1515  OT Visits / Re-Eval  Visit Number [phone]  Number of Visits 9  Date for OT Re-Evaluation 03/28/22  Authorization  Authorization Type workers comp  Authorization Time Period waiting on paperwork;  OT Time Calculation  OT Start Time 1430  OT Stop Time 1515  OT Time Calculation (min) 45 min  End of Session  Activity Tolerance Patient tolerated treatment well  Behavior During Therapy WFL for tasks assessed/performed     Past Medical History:  Diagnosis Date   Hearing deficit    Pneumonia    Past Surgical History:  Procedure Laterality Date   KNEE SURGERY     Patient Active Problem  List   Diagnosis Date Noted   TIA (transient ischemic attack) 03/15/2018   Tobacco use 03/15/2018   HTN (hypertension) 03/15/2018   Microscopic hematuria 03/15/2018   Insomnia 03/15/2018   RUE numbness 03/15/2018    ONSET DATE: 01/06/22  REFERRING DIAG: Contusion of left wrist, peripheral nerve contusion  THERAPY DIAG:  No diagnosis found.  Rationale for Evaluation and Treatment: Rehabilitation  SUBJECTIVE:   SUBJECTIVE STATEMENT: S: "I'm doing so much better, I'm ready to go back to work."   PRECAUTIONS: None  WEIGHT BEARING RESTRICTIONS: No  PAIN:  Are you having pain? Yes: NPRS scale: 6/10 Pain location: left dorsal wrist Pain description: aching, throbbing Aggravating factors: movement Relieving factors: rest  PATIENT GOALS: To be able to use his left hand and go back to work.   OBJECTIVE:   HAND DOMINANCE: Left  ADLs: Overall ADLs: Pt is having difficulty using dominant LUE during ADLs. Pt is unable to lift pots and pans for cooking, has difficulty lifting cups to take a drink. Opening jars and containers is difficulty. Unable to complete buttons, zippers, ties with left hand. Unable to use the can opener.    FUNCTIONAL OUTCOME MEASURES: Quick Dash: 81.82 03/24/22: 56.82  UPPER EXTREMITY ROM:     Active ROM Left eval Left 03/24/22  Wrist flexion 38 49  Wrist extension 15 70  Wrist ulnar deviation 32 36  Wrist radial deviation 20 20  Wrist pronation 90 90  Wrist supination 90 90  (Blank rows =  not tested)    *Pt able to flex and extend digits into a full fist.    UPPER EXTREMITY MMT:     MMT Left eval Left 03/24/22  Wrist flexion 3/5 5/5  Wrist extension 3/5 5/5  Wrist ulnar deviation 3/5 4+/5  Wrist radial deviation 3/5 5/5  Wrist pronation 3/5 5/5  Wrist supination 3//5 4+/5  (Blank rows = not tested)  HAND FUNCTION: Grip strength: Right: 75 lbs; Left: 58 lbs, Lateral pinch: Right: 22 lbs, Left: 11 lbs, and 3 point pinch: Right: 16  lbs, Left: 8 lbs Grip strength: Right: 85 lbs; Left: 95 lbs, Lateral pinch: Right: 23 lbs, Left: 24 lbs, and 3 point pinch: Right: 17 lbs, Left: 5 lbs *Red dynamometer  COORDINATION: Not tested. Will test at a later date if needed.   SENSATION: Pt reports numbness along dorsal surface medial nerve distribution. Otherwise LUE sensation is intact   EDEMA: Mild swelling throughout L wrist   R Wrist: 17.5cm  -- 17.5cm  L Wrist: 18cm      -- 18cm  R/L MCP: 22cm   -- 21.5    COGNITION: Overall cognitive status: Within functional limits for tasks assessed   OBSERVATIONS: Noted decreased ROM when attempting to extend 3rd and 4th digit with hand flat on table. Pt able to make full fist and extend finger out of fist fully.    TODAY'S TREATMENT:                                                                                                                              DATE:   03/24/22 -A/ROM: Wrist extension, flexion, ulnar deviation, radial deviation, supination, pronation, 1x10 -Wrist strengthening: 1#-wrist flexion, extension, radial/ulnar deviation, 10 reps -Median nerve floss, 1x10   03/22/22 -A/ROM: seated with forearm propped on weighted ball-wrist flexion/extension, radial/ulnar deviation, 10 reps each -A/ROM: forearm supination/pronation, 10 reps, arm adducted to side -Wrist strengthening: 1#-wrist flexion, extension, radial/ulnar deviation, 10 reps -Hand gripper: large and medium beads with vertical gripper, small beads with horizontal gripper, 35# for all sizes, rest breaks as needed -Pinch activity: pt using green clothespin and 3 point pinch to stack 3 towers of 5 sponges, working to open the clothespin all the way to grasp sponges. Pt then transitioned to lateral pinch to place 15 sponges into the bucket.  -Coordination task: pt holding coins in palm, working to translate to fingertips and then place into slotted container. Pt completing without dropping, focusing on wrist  mobility during the task.   03/18/22 -A/ROM: Wrist extension, flexion, ulnar deviation, radial deviation, supination, pronation, 1x10 -Theraputty: roll into a ball and pull 10 marbles out -Theraband: wrist flexion, extension, ulnar deviation, radial deviation, supination, pronation, 1x10 -Gripper: 29# squeeze x10, pick up 5 medium beads -Digiflex: 3lb, full squeeze x10, each digit squeeze x5 each    PATIENT EDUCATION: Education details: Discussed HEP Person educated: Patient Education method: Explanation Education comprehension: verbalized understanding  HOME EXERCISE PROGRAM:  02/26/22:Wrist A/ROM 03/02/22: Theraputty 03/03/22: Median Nerve Floss 03/11/22: Scrub and carry 03/18/22: Wrist strengthening, red theraband  GOALS: Goals reviewed with patient? Yes  SHORT TERM GOALS: Target date: 04/21/2022    Pt will be provided with and educated on HEP to improve ability to use LUE as dominant during ADLs.   Goal status: MET  2.  Pt will increase left wrist A/ROM to [facility] to improve ability to perform functional reaching during dressing and bathing tasks using LUE.   Goal status: MET  3.  Pt will increase left wrist strength to 4+/5 or greater to improve ability to lift pots and pans during cooking tasks.   Goal status: MET  4.  Pt will increase left grip strength by 15# to improve ability to perform gripping and lifting tasks using left hand as dominant.   Goal status: MET  5.  Pt will be educated on splint wear and care to improve ability to use LUE as assist during ADLs while using splint for protection and support.   Goal status: MET  6.  Pt will decrease pain in left wrist to improve ability to sleep for 3+ hours without waking due to pain.    Goal status: NOT MET   ASSESSMENT:  CLINICAL IMPRESSION: This session pt was reassessed for discharge. He demonstrates improvement in strength, ROM, and grip/pinch strength. Therapist and pt reviewed HEP and pt demonstrated  good technique throughout. Therapist providing education for pt to continue wearing his splint and encouraging continued rest until next MD appointment. Pt has no further skilled OT needs at this time.  PLAN:  OT FREQUENCY: Discharge   Paulita Fujita, OTR/L [phone] 03/24/2022, 2:35 PM

## 2022-03-26 ENCOUNTER — Encounter (HOSPITAL_COMMUNITY): Payer: Self-pay | Admitting: Occupational Therapy

## 2022-04-07 ENCOUNTER — Ambulatory Visit (INDEPENDENT_AMBULATORY_CARE_PROVIDER_SITE_OTHER): Admitting: Orthopedic Surgery

## 2022-04-07 ENCOUNTER — Encounter: Payer: Self-pay | Admitting: Orthopedic Surgery

## 2022-04-07 VITALS — BP 146/85 | HR 86

## 2022-04-07 DIAGNOSIS — G5600 Carpal tunnel syndrome, unspecified upper limb: Secondary | ICD-10-CM

## 2022-04-07 DIAGNOSIS — S60212D Contusion of left wrist, subsequent encounter: Secondary | ICD-10-CM

## 2022-04-07 MED ORDER — MELOXICAM 7.5 MG PO TABS: 7.5000 mg | ORAL_TABLET | Freq: Every day | ORAL | 5 refills | Status: AC

## 2022-04-07 NOTE — Patient Instructions (Signed)
Return to work modified duty as per job description on January 3  See the doctor on January 10  Return to OT to have splint modified no formal therapy needed  Home exercises  Start meloxicam once a day for pain

## 2022-04-07 NOTE — Progress Notes (Addendum)
Worker's Compensation related follow-up visit   Chief Complaint  Patient presents with   Follow-up    Recheck on left wrist    66 year old male history of blunt trauma to the left hand and wrist  Today complains of pain along the thumb and plain with wrist flexion and continued sensory loss on the dorsum of the hand in the median nerve distribution  He finished his occupational therapy I will include notes related to that  He also came in with a modified duty plan  His examination shows tenderness over the base of the thumb and pain with wrist flexion more than anything.  He has decreased sensation in the median nerve distribution on the dorsum of the hand but not volar  He is wanting his splint cut down which is fine we can go to OT for that he does not need formal OT just home exercise program  His nurse was here with him today her name is Zena Amos, RN, CCM  Encounter Diagnoses  Name Primary?   Contusion of left wrist, subsequent encounter Yes   Median nerve compression    Assessment  I think the patient is improving nicely.  He does have the numbness and the pain with wrist flexion  I think he can return to work in a few weeks with the modified duty with the plan for full return to duty 6 weeks after that  He should continue using his splint I will start him on some meloxicam for pain  Meds ordered this encounter  Medications   meloxicam (MOBIC) 7.5 MG tablet    Sig: Take 1 tablet (7.5 mg total) by mouth daily.    Dispense:  30 tablet    Refill:  5    Plan  Continue splint for an additional 4 weeks.  Have modified at therapy/OT  Start meloxicam once a day  Return to work January 3 modified duty per job description  Plan for full duty 6 weeks after January 3  Follow-up January 10  Addendum see media

## 2022-05-12 ENCOUNTER — Ambulatory Visit: Payer: Self-pay | Admitting: Orthopedic Surgery

## 2022-05-24 ENCOUNTER — Encounter: Payer: Self-pay | Admitting: Orthopedic Surgery

## 2022-05-24 ENCOUNTER — Ambulatory Visit (INDEPENDENT_AMBULATORY_CARE_PROVIDER_SITE_OTHER): Admitting: Orthopedic Surgery

## 2022-05-24 DIAGNOSIS — S60212D Contusion of left wrist, subsequent encounter: Secondary | ICD-10-CM

## 2022-05-24 DIAGNOSIS — G5600 Carpal tunnel syndrome, unspecified upper limb: Secondary | ICD-10-CM

## 2022-05-24 DIAGNOSIS — S60212A Contusion of left wrist, initial encounter: Secondary | ICD-10-CM

## 2022-05-24 NOTE — Progress Notes (Signed)
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  He is willing to take on another position that does not require the lifting and heavy activities with the left upper extremity  In review last time we noted the following  67 year old male history of blunt trauma to the left hand and wrist   Today complains of pain along the thumb and plain with wrist flexion and continued sensory loss on the dorsum of the hand in the median nerve distribution   He finished his occupational therapy I will include notes related to that   He also came in with a modified duty plan   His examination shows tenderness over the base of the thumb and pain with wrist flexion more than anything.  He has decreased sensation in the median nerve distribution on the dorsum of the hand but not volar   He is wanting his splint cut down which is fine we can go to OT for that he does not need formal OT just home exercise program   His nurse was here with him today her name is Leta Speller, RN, CCM    Encounter Diagnoses  Name Primary?   Contusion of left wrist, initial encounter Yes   Median nerve compression    Contusion of left wrist, subsequent encounter    Follow-up as needed

## 2022-05-24 NOTE — Patient Instructions (Addendum)
  Note to employer  Patient was unable to perform present job duties  Please switch the patient to position it does not require anything that requires lifting over 15 pounds

## 2022-05-28 ENCOUNTER — Telehealth: Payer: Self-pay | Admitting: Orthopedic Surgery

## 2022-05-28 NOTE — Telephone Encounter (Signed)
[employer] [phone])  (couldn't understand the callers name) requesting 05/24/22 notes, orders, work note and date of next visit. Ref/Claim # [id number], Fax # [phone].

## 2022-06-01 ENCOUNTER — Telehealth: Payer: Self-pay | Admitting: Orthopedic Surgery

## 2022-06-01 NOTE — Telephone Encounter (Signed)
Charlie w/[employer] [phone]) lvm requesting work note for 05/24/22 and complete medical records/note and any order for this patient.  Claim # [id number].  Fax # [phone]

## 2022-06-02 NOTE — Telephone Encounter (Signed)
Done
